# Patient Record
Sex: Female | Born: 1990 | Race: Black or African American | Hispanic: No | Marital: Single | Smoking: Former smoker
Health system: Southern US, Community
[De-identification: ages and names within clinical notes are randomized; demographics above are authoritative.]

## PROBLEM LIST (undated history)

## (undated) DIAGNOSIS — F329 Major depressive disorder, single episode, unspecified: Secondary | ICD-10-CM

## (undated) DIAGNOSIS — E119 Type 2 diabetes mellitus without complications: Secondary | ICD-10-CM

## (undated) DIAGNOSIS — F419 Anxiety disorder, unspecified: Secondary | ICD-10-CM

## (undated) DIAGNOSIS — F32A Depression, unspecified: Secondary | ICD-10-CM

## (undated) DIAGNOSIS — G4700 Insomnia, unspecified: Secondary | ICD-10-CM

---

## 2010-09-10 ENCOUNTER — Emergency Department (HOSPITAL_COMMUNITY)
Admission: EM | Admit: 2010-09-10 | Discharge: 2010-09-11 | Disposition: A | Discharge status: Home / Self Care | Payer: 59 | Attending: Emergency Medicine | Admitting: Emergency Medicine

## 2010-09-10 DIAGNOSIS — R112 Nausea with vomiting, unspecified: Secondary | ICD-10-CM | POA: Insufficient documentation

## 2010-09-10 DIAGNOSIS — R109 Unspecified abdominal pain: Secondary | ICD-10-CM | POA: Insufficient documentation

## 2010-09-10 DIAGNOSIS — R197 Diarrhea, unspecified: Secondary | ICD-10-CM | POA: Insufficient documentation

## 2010-09-10 LAB — BASIC METABOLIC PANEL
CO2: 22 mEq/L (ref 19–32)
Chloride: 98 mEq/L (ref 96–112)
GFR calc Af Amer: >60 mL/min (ref >60)
Potassium: 5.5 mEq/L — ABNORMAL HIGH (ref 3.5–5.1)
Sodium: 134 mEq/L — ABNORMAL LOW (ref 135–145)

## 2010-09-10 LAB — DIFFERENTIAL
Basophils Absolute: 0 10*3/uL (ref 0.0–0.1)
Basophils Relative: 0 % (ref 0–1)
Monocytes Relative: 5 % (ref 3–12)
Neutro Abs: 16.8 10*3/uL — ABNORMAL HIGH (ref 1.7–7.7)
Neutrophils Relative %: 90 % — ABNORMAL HIGH (ref 43–77)

## 2010-09-10 LAB — CBC
Hemoglobin: 15.9 g/dL — ABNORMAL HIGH (ref 12.0–15.0)
RBC: 6.37 MIL/uL — ABNORMAL HIGH (ref 3.87–5.11)

## 2010-09-11 ENCOUNTER — Emergency Department (HOSPITAL_COMMUNITY): Payer: 59

## 2010-09-11 LAB — HEPATIC FUNCTION PANEL
AST: 27 U/L (ref 0–37)
Albumin: 4.8 g/dL (ref 3.5–5.2)
Alkaline Phosphatase: 96 U/L (ref 39–117)
Total Bilirubin: 1.6 mg/dL — ABNORMAL HIGH (ref 0.3–1.2)

## 2010-09-11 LAB — URINE MICROSCOPIC-ADD ON

## 2010-09-11 LAB — URINALYSIS, ROUTINE W REFLEX MICROSCOPIC
Bilirubin Urine: NEGATIVE
Nitrite: NEGATIVE
Specific Gravity, Urine: 1.027 (ref 1.005–1.030)
pH: 5.5 (ref 5.0–8.0)

## 2010-09-11 MED ORDER — IOHEXOL 300 MG/ML  SOLN
125.0000 mL | Freq: Once | INTRAMUSCULAR | Status: AC | PRN
  Administered 2010-09-11: 125 mL via INTRAVENOUS

## 2014-09-10 ENCOUNTER — Encounter: Payer: Self-pay | Admitting: Family Medicine

## 2014-09-10 ENCOUNTER — Ambulatory Visit (INDEPENDENT_AMBULATORY_CARE_PROVIDER_SITE_OTHER): Payer: 59 | Admitting: Family Medicine

## 2014-09-10 VITALS — BP 130/82 | Ht 61.0 in | Wt 158.0 lb

## 2014-09-10 DIAGNOSIS — M272 Inflammatory conditions of jaws: Secondary | ICD-10-CM | POA: Diagnosis not present

## 2014-09-10 MED ORDER — HYDROCODONE-ACETAMINOPHEN 5-325 MG PO TABS: 1.0000 | ORAL_TABLET | Freq: Four times a day (QID) | ORAL | Status: DC | PRN

## 2014-09-10 MED ORDER — PENICILLIN V POTASSIUM 500 MG PO TABS: 500.0000 mg | ORAL_TABLET | Freq: Four times a day (QID) | ORAL | Status: DC

## 2014-09-10 NOTE — Progress Notes (Signed)
   Subjective:    Patient ID: Ann Richards, female    DOB: 10/11/1990, 24 y.o.   MRN: [id number]  HPI Patient arrives with left sided facial pain for 1.5 weeks.  Shooting pain  Really bad  Throbbing and aching  tyl helps about an hour  aleave doesbt help at all     Review of Systems No headache no chest pain no back pain no abdominal pain ROS otherwise negative    Objective:   Physical Exam   Alert no major distress lungs clear heart rare rhythm gums very inflamed particularly lower jaw tender percussion to molar tooth pharynx normal neck supple lungs clear. Heart regular in rhythm.     Assessment & Plan:  Impression odontogenic infection with gingivitis plan penicillin 4 times a day 10 days. Pain medication prescribed. Strongly encouraged calling dentist as soon as possible [facility]

## 2014-09-18 ENCOUNTER — Ambulatory Visit: Payer: 59 | Admitting: Nurse Practitioner

## 2014-10-06 ENCOUNTER — Ambulatory Visit (INDEPENDENT_AMBULATORY_CARE_PROVIDER_SITE_OTHER): Payer: 59 | Admitting: Nurse Practitioner

## 2014-10-06 ENCOUNTER — Encounter: Payer: Self-pay | Admitting: Nurse Practitioner

## 2014-10-06 ENCOUNTER — Encounter: Payer: Self-pay | Admitting: Family Medicine

## 2014-10-06 VITALS — BP 164/96 | Temp 98.7°F | Ht 61.0 in | Wt 148.4 lb

## 2014-10-06 DIAGNOSIS — J069 Acute upper respiratory infection, unspecified: Secondary | ICD-10-CM

## 2014-10-06 DIAGNOSIS — J209 Acute bronchitis, unspecified: Secondary | ICD-10-CM | POA: Diagnosis not present

## 2014-10-06 DIAGNOSIS — R062 Wheezing: Secondary | ICD-10-CM | POA: Diagnosis not present

## 2014-10-06 DIAGNOSIS — B9689 Other specified bacterial agents as the cause of diseases classified elsewhere: Secondary | ICD-10-CM

## 2014-10-06 MED ORDER — AZITHROMYCIN 250 MG PO TABS: ORAL_TABLET | ORAL | Status: DC

## 2014-10-06 MED ORDER — ALBUTEROL SULFATE (2.5 MG/3ML) 0.083% IN NEBU
2.5000 mg | INHALATION_SOLUTION | Freq: Once | RESPIRATORY_TRACT | Status: AC
  Administered 2014-10-06: 2.5 mg via RESPIRATORY_TRACT

## 2014-10-06 MED ORDER — PREDNISONE 20 MG PO TABS: ORAL_TABLET | ORAL | Status: DC

## 2014-10-06 MED ORDER — ALBUTEROL SULFATE HFA 108 (90 BASE) MCG/ACT IN AERS: 2.0000 | INHALATION_SPRAY | RESPIRATORY_TRACT | Status: DC | PRN

## 2014-10-07 ENCOUNTER — Ambulatory Visit: Payer: 59 | Admitting: Nurse Practitioner

## 2014-10-07 ENCOUNTER — Encounter: Payer: Self-pay | Admitting: Family Medicine

## 2014-10-07 ENCOUNTER — Telehealth: Payer: Self-pay | Admitting: Family Medicine

## 2014-10-07 MED ORDER — AMOXICILLIN 500 MG PO CAPS: 500.0000 mg | ORAL_CAPSULE | Freq: Three times a day (TID) | ORAL | Status: DC

## 2014-10-07 NOTE — Telephone Encounter (Signed)
Pt called stating that the azithromycin that she was prescribed last night  Gave her stomach pains and was vomiting.

## 2014-10-07 NOTE — Telephone Encounter (Signed)
Rx sent electronically to pharmacy. Patient notified. Patient needs a doctors note for work today and will come by to pick it up.

## 2014-10-07 NOTE — Telephone Encounter (Signed)
amox 500 tid for ten d

## 2014-10-08 ENCOUNTER — Encounter: Payer: Self-pay | Admitting: Nurse Practitioner

## 2014-10-08 NOTE — Progress Notes (Signed)
Subjective:  Presents for c/o cold symptoms for over a week. Fever improved. Sinus headache. Cough producing light green sputum. Right ear pain. Runny nose. Some wheezing. Taking fluids well. Voiding nl.   Objective:   BP 164/96 mmHg  Temp(Src) 98.7 F (37.1 C) (Oral)  Ht 5\' 1"  (1.549 m)  Wt 148 lb 6.4 oz (67.314 kg)  BMI 28.05 kg/m2  LMP 09/15/2014 NAD. Alert, oriented. TMs retracted, no erythema. Pharynx injected with PND noted. Neck supple with mild anterior adenopathy. Lungs initially mildly decreased breath sounds with scattered expiratory wheezes. No tachypnea. Normal color. Given albuterol 2.5 mg neb treatment. Wheezing resolved with subjective improvement of symptoms. Heart RRR.   Assessment: Bacterial upper respiratory infection - Plan: albuterol (PROVENTIL) (2.5 MG/3ML) 0.083% nebulizer solution 2.5 mg  Acute bronchitis, unspecified organism - Plan: albuterol (PROVENTIL) (2.5 MG/3ML) 0.083% nebulizer solution 2.5 mg  Wheezing - Plan: albuterol (PROVENTIL) (2.5 MG/3ML) 0.083% nebulizer solution 2.5 mg  Plan:  Meds ordered this encounter  Medications  . albuterol (PROVENTIL) (2.5 MG/3ML) 0.083% nebulizer solution 2.5 mg    Sig:   . predniSONE (DELTASONE) 20 MG tablet    Sig: 3 po qd x 3 d then 2 po qd x 3 d then 1 po qd x 3 d    Dispense:  18 tablet    Refill:  0    Order Specific Question:  Supervising Provider    Answer:  Merlyn AlbertLUKING, WILLIAM S [[id number]]  . albuterol (PROVENTIL HFA;VENTOLIN HFA) 108 (90 BASE) MCG/ACT inhaler    Sig: Inhale 2 puffs into the lungs every 4 (four) hours as needed.    Dispense:  1 Inhaler    Refill:  0    Order Specific Question:  Supervising Provider    Answer:  Merlyn AlbertLUKING, WILLIAM S [[id number]]  . azithromycin (ZITHROMAX Z-PAK) 250 MG tablet    Sig: Take 2 tablets (500 mg) on  Day 1,  followed by 1 tablet (250 mg) once daily on Days 2 through 5.    Dispense:  6 each    Refill:  0    Order Specific Question:  Supervising Provider    Answer:  Merlyn AlbertLUKING,  WILLIAM S [[id number]]   Reviewed symptomatic care and warning signs. Call back in 72 hours if no improvement, sooner if worse.

## 2015-06-10 ENCOUNTER — Encounter: Payer: Self-pay | Admitting: Family Medicine

## 2015-06-10 ENCOUNTER — Ambulatory Visit (INDEPENDENT_AMBULATORY_CARE_PROVIDER_SITE_OTHER): Payer: 59 | Admitting: Family Medicine

## 2015-06-10 VITALS — BP 134/88 | Temp 98.8°F | Ht 61.0 in | Wt 158.5 lb

## 2015-06-10 DIAGNOSIS — K047 Periapical abscess without sinus: Secondary | ICD-10-CM

## 2015-06-10 DIAGNOSIS — J019 Acute sinusitis, unspecified: Secondary | ICD-10-CM

## 2015-06-10 MED ORDER — AMOXICILLIN 500 MG PO CAPS: 500.0000 mg | ORAL_CAPSULE | Freq: Three times a day (TID) | ORAL | Status: DC

## 2015-06-10 MED ORDER — HYDROCODONE-ACETAMINOPHEN 5-325 MG PO TABS: 1.0000 | ORAL_TABLET | ORAL | Status: DC | PRN

## 2015-06-10 NOTE — Progress Notes (Signed)
   Subjective:    Patient ID: Ann Richards, female    DOB: 17-Apr-1991, 24 y.o.   MRN: [id number]  Sinusitis This is a new problem. The current episode started in the past 7 days. Associated symptoms include chills, congestion, coughing, ear pain, headaches and a sore throat. Pertinent negatives include no shortness of breath. Treatments tried: Aleve, OTC cough suppresant.   Patient also has c/o of tooth pain.   patient relates tooth pain swelling around the to swelling in the jaw hurts with chewing present over the past couple days  Review of Systems  Constitutional: Positive for chills. Negative for fever and activity change.  HENT: Positive for congestion, ear pain, rhinorrhea and sore throat.   Eyes: Negative for discharge.  Respiratory: Positive for cough. Negative for shortness of breath and wheezing.   Cardiovascular: Negative for chest pain.  Neurological: Positive for headaches.   Jaw pain and swelling    Objective:   Physical Exam  Constitutional: She appears well-developed.  HENT:  Head: Normocephalic.  Nose: Nose normal.  Mouth/Throat: Oropharynx is clear and moist. No oropharyngeal exudate.  Neck: Neck supple.  Cardiovascular: Normal rate and normal heart sounds.   No murmur heard. Pulmonary/Chest: Effort normal and breath sounds normal. She has no wheezes.  Lymphadenopathy:    She has no cervical adenopathy.  Skin: Skin is warm and dry.  Nursing note and vitals reviewed.         Assessment & Plan:   patient has an infection of the jaw area I believe is due to a dental infection. Amoxicillin 3 times a day, Vicodin for pain, see her dentist. May need tooth pull or root canal.  Rhinosinusitis mild antibiotic should help this  Lab work x-rays not indicated currently if worse follow-up

## 2015-07-03 ENCOUNTER — Emergency Department (HOSPITAL_COMMUNITY)
Admission: EM | Admit: 2015-07-03 | Discharge: 2015-07-03 | Disposition: A | Discharge status: Home / Self Care | Payer: 59 | Attending: Emergency Medicine | Admitting: Emergency Medicine

## 2015-07-03 ENCOUNTER — Encounter (HOSPITAL_COMMUNITY): Payer: Self-pay | Admitting: *Deleted

## 2015-07-03 ENCOUNTER — Emergency Department (HOSPITAL_COMMUNITY): Payer: 59

## 2015-07-03 DIAGNOSIS — Y998 Other external cause status: Secondary | ICD-10-CM | POA: Insufficient documentation

## 2015-07-03 DIAGNOSIS — Y9389 Activity, other specified: Secondary | ICD-10-CM | POA: Insufficient documentation

## 2015-07-03 DIAGNOSIS — Z3202 Encounter for pregnancy test, result negative: Secondary | ICD-10-CM | POA: Insufficient documentation

## 2015-07-03 DIAGNOSIS — Z87891 Personal history of nicotine dependence: Secondary | ICD-10-CM | POA: Diagnosis not present

## 2015-07-03 DIAGNOSIS — Y92410 Unspecified street and highway as the place of occurrence of the external cause: Secondary | ICD-10-CM | POA: Diagnosis not present

## 2015-07-03 DIAGNOSIS — S199XXA Unspecified injury of neck, initial encounter: Secondary | ICD-10-CM | POA: Diagnosis present

## 2015-07-03 DIAGNOSIS — S0003XA Contusion of scalp, initial encounter: Secondary | ICD-10-CM | POA: Diagnosis not present

## 2015-07-03 DIAGNOSIS — S161XXA Strain of muscle, fascia and tendon at neck level, initial encounter: Secondary | ICD-10-CM | POA: Insufficient documentation

## 2015-07-03 DIAGNOSIS — Z79899 Other long term (current) drug therapy: Secondary | ICD-10-CM | POA: Insufficient documentation

## 2015-07-03 DIAGNOSIS — S0990XA Unspecified injury of head, initial encounter: Secondary | ICD-10-CM

## 2015-07-03 LAB — POC URINE PREG, ED: Preg Test, Ur: NEGATIVE

## 2015-07-03 MED ORDER — CYCLOBENZAPRINE HCL 10 MG PO TABS: 10.0000 mg | ORAL_TABLET | Freq: Three times a day (TID) | ORAL | Status: DC | PRN

## 2015-07-03 MED ORDER — NAPROXEN 500 MG PO TABS: 500.0000 mg | ORAL_TABLET | Freq: Two times a day (BID) | ORAL | Status: DC

## 2015-07-03 MED ORDER — HYDROCODONE-ACETAMINOPHEN 5-325 MG PO TABS: ORAL_TABLET | ORAL | Status: DC

## 2015-07-03 NOTE — Discharge Instructions (Signed)
Cervical Sprain A cervical sprain is when the tissues (ligaments) that hold the neck bones in place stretch or tear. HOME CARE   Put ice on the injured area.  Put ice in a plastic bag.  Place a towel between your skin and the bag.  Leave the ice on for 15-20 minutes, 3-4 times a day.  You may have been given a collar to wear. This collar keeps your neck from moving while you heal.  Do not take the collar off unless told by your doctor.  If you have long hair, keep it outside of the collar.  Ask your doctor before changing the position of your collar. You may need to change its position over time to make it more comfortable.  If you are allowed to take off the collar for cleaning or bathing, follow your doctor's instructions on how to do it safely.  Keep your collar clean by wiping it with mild soap and water. Dry it completely. If the collar has removable pads, remove them every 1-2 days to hand wash them with soap and water. Allow them to air dry. They should be dry before you wear them in the collar.  Do not drive while wearing the collar.  Only take medicine as told by your doctor.  Keep all doctor visits as told.  Keep all physical therapy visits as told.  Adjust your work station so that you have good posture while you work.  Avoid positions and activities that make your problems worse.  Warm up and stretch before being active. GET HELP IF:  Your pain is not controlled with medicine.  You cannot take less pain medicine over time as planned.  Your activity level does not improve as expected. GET HELP RIGHT AWAY IF:   You are bleeding.  Your stomach is upset.  You have an allergic reaction to your medicine.  You develop new problems that you cannot explain.  You lose feeling (become numb) or you cannot move any part of your body (paralysis).  You have tingling or weakness in any part of your body.  Your symptoms get worse. Symptoms include:  Pain,  soreness, stiffness, puffiness (swelling), or a burning feeling in your neck.  Pain when your neck is touched.  Shoulder or upper back pain.  Limited ability to move your neck.  Headache.  Dizziness.  Your hands or arms feel week, lose feeling, or tingle.  Muscle spasms.  Difficulty swallowing or chewing. MAKE SURE YOU:   Understand these instructions.  Will watch your condition.  Will get help right away if you are not doing well or get worse.   This information is not intended to replace advice given to you by your health care provider. Make sure you discuss any questions you have with your health care provider.   Document Released: 11/23/2007 Document Revised: 02/06/2013 Document Reviewed: 12/12/2012 Elsevier Interactive Patient Education 2016 [employer]  Head Injury, Adult You have a head injury. Headaches and throwing up (vomiting) are common after a head injury. It should be easy to wake up from sleeping. Sometimes you must stay in the hospital. Most problems happen within the first 24 hours. Side effects may occur up to 7-10 days after the injury.  WHAT ARE THE TYPES OF HEAD INJURIES? Head injuries can be as minor as a bump. Some head injuries can be more severe. More severe head injuries include:  A jarring injury to the brain (concussion).  A bruise of the brain (contusion). This  there is bleeding in the brain that can cause swelling.  A cracked skull (skull fracture).  Bleeding in the brain that collects, clots, and forms a bump (hematoma). WHEN SHOULD I GET HELP RIGHT AWAY?   You are confused or sleepy.  You cannot be woken up.  You feel sick to your stomach (nauseous) or keep throwing up (vomiting).  Your dizziness or unsteadiness is getting worse.  You have very bad, lasting headaches that are not helped by medicine. Take medicines only as told by your doctor.  You cannot use your arms or legs like normal.  You cannot walk.  You notice  changes in the black spots in the center of the colored part of your eye (pupil).  You have clear or bloody fluid coming from your nose or ears.  You have trouble seeing. During the next 24 hours after the injury, you must stay with someone who can watch you. This person should get help right away (call 911 in the [place] if you start to shake and are not able to control it (have seizures), you pass out, or you are unable to wake up. HOW CAN I PREVENT A HEAD INJURY IN THE FUTURE?  Wear seat belts.  Wear a helmet while bike riding and playing sports like football.  Stay away from dangerous activities around the house. WHEN CAN I RETURN TO NORMAL ACTIVITIES AND ATHLETICS? See your doctor before doing these activities. You should not do normal activities or play contact sports until 1 week after the following symptoms have stopped:  Headache that does not go away.  Dizziness.  Poor attention.  Confusion.  Memory problems.  Sickness to your stomach or throwing up.  Tiredness.  Fussiness.  Bothered by bright lights or loud noises.  Anxiousness or depression.  Restless sleep. MAKE SURE YOU:   Understand these instructions.  Will watch your condition.  Will get help right away if you are not doing well or get worse.   This information is not intended to replace advice given to you by your health care provider. Make sure you discuss any questions you have with your health care provider.   Document Released: 05/19/2008 Document Revised: 06/27/2014 Document Reviewed: 02/11/2013 Elsevier Interactive Patient Education 2016 Elsevier Inc.  

## 2015-07-03 NOTE — ED Notes (Signed)
Talked withpt about pt's BP and need to follow closely. Pt reports she had taken doses of cold medication over the past couple of days and that usually makes her BP high. Pt encouraged to recheck her pressure and follow-up if it remains elevated.
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
## 2015-08-04 ENCOUNTER — Encounter: Payer: Self-pay | Admitting: Nurse Practitioner

## 2015-08-04 ENCOUNTER — Ambulatory Visit (INDEPENDENT_AMBULATORY_CARE_PROVIDER_SITE_OTHER): Payer: 59 | Admitting: Nurse Practitioner

## 2015-08-04 ENCOUNTER — Encounter: Payer: Self-pay | Admitting: Family Medicine

## 2015-08-04 VITALS — BP 122/84 | Temp 98.8°F | Ht 61.0 in | Wt 159.0 lb

## 2015-08-04 DIAGNOSIS — K047 Periapical abscess without sinus: Secondary | ICD-10-CM

## 2015-08-04 MED ORDER — PENICILLIN V POTASSIUM 500 MG PO TABS: 500.0000 mg | ORAL_TABLET | Freq: Four times a day (QID) | ORAL | Status: DC

## 2015-08-04 MED ORDER — ALBUTEROL SULFATE HFA 108 (90 BASE) MCG/ACT IN AERS: 2.0000 | INHALATION_SPRAY | RESPIRATORY_TRACT | Status: DC | PRN

## 2015-08-04 MED ORDER — HYDROCODONE-ACETAMINOPHEN 5-325 MG PO TABS: ORAL_TABLET | ORAL | Status: DC

## 2015-08-04 MED ORDER — FLUCONAZOLE 150 MG PO TABS: ORAL_TABLET | ORAL | Status: DC

## 2015-08-06 ENCOUNTER — Encounter: Payer: Self-pay | Admitting: Nurse Practitioner

## 2015-08-06 NOTE — Progress Notes (Signed)
Subjective:  Presents for complaints of pain related to an abscessed tooth for the past 2 days. Has an appointment with an oral surgeon in 2 weeks for evaluation. No fever. Mild headache and ear pain on the right side where tooth is infected. Taking fluids well. Voiding normal limit. Is able to eat soft foods.  Objective:   BP 122/84 mmHg  Temp(Src) 98.8 F (37.1 C) (Oral)  Ht  (1.549 m)  Wt 159 lb (72.122 kg)  BMI 30.06 kg/m2 NAD. Alert, oriented. Mild right mid facial edema noted. No erythema. Tenderness and mild erythema noted all along the right lower gingiva near the teeth, very tender to light palpation.  Assessment: Abscessed tooth  Plan:  Meds ordered this encounter  Medications  . penicillin v potassium (VEETID) 500 MG tablet    Sig: Take 1 tablet (500 mg total) by mouth 4 (four) times daily.    Dispense:  40 tablet    Refill:  0    Order Specific Question:  Supervising Provider    Answer:  [name] [[id number]]  . fluconazole (DIFLUCAN) 150 MG tablet    Sig: One po qd prn yeast infection; may repeat in 3-4 days if needed    Dispense:  2 tablet    Refill:  0    Order Specific Question:  Supervising Provider    Answer:  [name] [[id number]]  . HYDROcodone-acetaminophen (NORCO/VICODIN) 5-325 MG tablet    Sig: Take one tab po q 4-6 hrs prn pain    Dispense:  20 tablet    Refill:  0    Order Specific Question:  Supervising Provider    Answer:  [name] [[id number]]  . albuterol (PROVENTIL HFA;VENTOLIN HFA) 108 (90 Base) MCG/ACT inhaler    Sig: Inhale 2 puffs into the lungs every 4 (four) hours as needed.    Dispense:  1 Inhaler    Refill:  0    Order Specific Question:  Supervising Provider    Answer:  [name] [[id number]]   Use pain medicine sparingly, drowsiness precautions. Call back in 48 hours if no improvement, sooner if worse. Warning signs reviewed. Otherwise follow-up with oral surgeon as planned. Also continue ibuprofen as directed.

## 2015-11-12 ENCOUNTER — Encounter: Payer: Self-pay | Admitting: Family Medicine

## 2015-11-12 ENCOUNTER — Ambulatory Visit (INDEPENDENT_AMBULATORY_CARE_PROVIDER_SITE_OTHER): Payer: 59 | Admitting: Family Medicine

## 2015-11-12 VITALS — BP 116/76 | Ht 61.0 in | Wt 157.1 lb

## 2015-11-12 DIAGNOSIS — F411 Generalized anxiety disorder: Secondary | ICD-10-CM | POA: Diagnosis not present

## 2015-11-12 DIAGNOSIS — F329 Major depressive disorder, single episode, unspecified: Secondary | ICD-10-CM | POA: Diagnosis not present

## 2015-11-12 DIAGNOSIS — F32A Depression, unspecified: Secondary | ICD-10-CM

## 2015-11-12 MED ORDER — ALBUTEROL SULFATE HFA 108 (90 BASE) MCG/ACT IN AERS: 2.0000 | INHALATION_SPRAY | RESPIRATORY_TRACT | Status: DC | PRN

## 2015-11-12 MED ORDER — ALPRAZOLAM 0.5 MG PO TABS: ORAL_TABLET | ORAL | Status: DC

## 2015-11-12 MED ORDER — ESCITALOPRAM OXALATE 10 MG PO TABS: 10.0000 mg | ORAL_TABLET | Freq: Every day | ORAL | Status: DC
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
## 2015-11-12 NOTE — Patient Instructions (Signed)
Please dont miss your follow up visitGeneralized Anxiety Disorder Generalized anxiety disorder (GAD) is a mental disorder. It interferes with life functions, including relationships, work, and school. GAD is different from normal anxiety, which everyone experiences at some point in their lives in response to specific life events and activities. Normal anxiety actually helps us prepare for and get through these life events and activities. Normal anxiety goes away after the event or activity is over.  GAD causes anxiety that is not necessarily related to specific events or activities. It also causes excess anxiety in proportion to specific events or activities. The anxiety associated with GAD is also difficult to control. GAD can vary from mild to severe. People with severe GAD can have intense waves of anxiety with physical symptoms (panic attacks).  SYMPTOMS The anxiety and worry associated with GAD are difficult to control. This anxiety and worry are related to many life events and activities and also occur more days than not for 6 months or longer. People with GAD also have three or more of the following symptoms (one or more in children):  Restlessness.   Fatigue.  Difficulty concentrating.   Irritability.  Muscle tension.  Difficulty sleeping or unsatisfying sleep. DIAGNOSIS GAD is diagnosed through an assessment by your health care provider. Your health care provider will ask you questions aboutyour mood,physical symptoms, and events in your life. Your health care provider may ask you about your medical history and use of alcohol or drugs, including prescription medicines. Your health care provider may also do a physical exam and blood tests. Certain medical conditions and the use of certain substances can cause symptoms similar to those associated with GAD. Your health care provider may refer you to a mental health specialist for further evaluation. TREATMENT The following therapies  are usually used to treat GAD:   Medication. Antidepressant medication usually is prescribed for long-term daily control. Antianxiety medicines may be added in severe cases, especially when panic attacks occur.   Talk therapy (psychotherapy). Certain types of talk therapy can be helpful in treating GAD by providing support, education, and guidance. A form of talk therapy called cognitive behavioral therapy can teach you healthy ways to think about and react to daily life events and activities.  Stress managementtechniques. These include yoga, meditation, and exercise and can be very helpful when they are practiced regularly. A mental health specialist can help determine which treatment is best for you. Some people see improvement with one therapy. However, other people require a combination of therapies.   This information is not intended to replace advice given to you by your health care provider. Make sure you discuss any questions you have with your health care provider.   Document Released: 10/01/2012 Document Revised: 06/27/2014 Document Reviewed: 10/01/2012 Elsevier Interactive Patient Education [employer]2016 [employer]vier Inc.

## 2015-11-13 ENCOUNTER — Telehealth: Payer: Self-pay | Admitting: Family Medicine

## 2015-11-13 DIAGNOSIS — F4323 Adjustment disorder with mixed anxiety and depressed mood: Secondary | ICD-10-CM

## 2015-11-13 NOTE — Telephone Encounter (Signed)
Referral ordered in EPIC. Patient notified. 

## 2015-11-13 NOTE — Telephone Encounter (Signed)
Pt called stating that she would like the referral that Dr. Lorin PicketScott offered to see psychiatrist.

## 2015-11-13 NOTE — Telephone Encounter (Signed)
Please initiate referral to psychiatry for anxiety related issues let the patient know that this has been done at her request

## 2015-11-16 DIAGNOSIS — F329 Major depressive disorder, single episode, unspecified: Secondary | ICD-10-CM | POA: Insufficient documentation

## 2015-11-16 DIAGNOSIS — F411 Generalized anxiety disorder: Secondary | ICD-10-CM | POA: Insufficient documentation

## 2015-11-16 DIAGNOSIS — F32A Depression, unspecified: Secondary | ICD-10-CM | POA: Insufficient documentation

## 2015-11-17 ENCOUNTER — Encounter: Payer: Self-pay | Admitting: Family Medicine

## 2015-11-26 ENCOUNTER — Telehealth (HOSPITAL_COMMUNITY): Payer: Self-pay | Admitting: *Deleted

## 2015-11-26 NOTE — Telephone Encounter (Signed)
spoke with patient regarding an appointment.   She said she will call us back when she find out how much her copay is.

## 2015-12-01 ENCOUNTER — Telehealth (HOSPITAL_COMMUNITY): Payer: Self-pay | Admitting: *Deleted

## 2015-12-01 NOTE — Telephone Encounter (Signed)
Office received ref from [facility]. To sch new pt appt for pt. Spoke with pt and she stated that she wanted to wait and the last lady she talked to she told them that she would call them back. [facility] informed [facility] of what pt stated and stated she will note that.

## 2015-12-14 ENCOUNTER — Other Ambulatory Visit: Payer: Self-pay | Admitting: Family Medicine

## 2015-12-14 ENCOUNTER — Ambulatory Visit: Payer: 59 | Admitting: Family Medicine

## 2015-12-15 NOTE — Telephone Encounter (Signed)
Is pt seeing psychiatrist ?(If so, they should rx the xanax. Or is she seeing psychologist, if so refill times one since they do not rx meds

## 2015-12-15 NOTE — Telephone Encounter (Signed)
Pt states she is not seeing a psychiatrist. She is seeing a Veterinary surgeoncounselor. Med refilled one time

## 2016-03-08 ENCOUNTER — Other Ambulatory Visit: Payer: Self-pay | Admitting: Family Medicine

## 2016-03-09 ENCOUNTER — Ambulatory Visit (INDEPENDENT_AMBULATORY_CARE_PROVIDER_SITE_OTHER): Payer: 59 | Admitting: Family Medicine

## 2016-03-09 ENCOUNTER — Encounter: Payer: Self-pay | Admitting: Family Medicine

## 2016-03-09 VITALS — BP 120/76 | Temp 98.3°F | Ht 61.0 in | Wt 155.0 lb

## 2016-03-09 DIAGNOSIS — B9689 Other specified bacterial agents as the cause of diseases classified elsewhere: Secondary | ICD-10-CM

## 2016-03-09 DIAGNOSIS — J019 Acute sinusitis, unspecified: Secondary | ICD-10-CM

## 2016-03-09 DIAGNOSIS — J4520 Mild intermittent asthma, uncomplicated: Secondary | ICD-10-CM | POA: Diagnosis not present

## 2016-03-09 MED ORDER — ALBUTEROL SULFATE (2.5 MG/3ML) 0.083% IN NEBU
2.5000 mg | INHALATION_SOLUTION | Freq: Once | RESPIRATORY_TRACT | Status: AC
  Administered 2016-03-09: 2.5 mg via RESPIRATORY_TRACT

## 2016-03-09 MED ORDER — METHYLPREDNISOLONE 4 MG PO TABS: ORAL_TABLET | ORAL | 0 refills | Status: DC

## 2016-03-09 MED ORDER — METHYLPREDNISOLONE ACETATE 40 MG/ML IJ SUSP
40.0000 mg | Freq: Once | INTRAMUSCULAR | Status: AC
  Administered 2016-03-09: 40 mg via INTRAMUSCULAR

## 2016-03-09 MED ORDER — AZITHROMYCIN 250 MG PO TABS: ORAL_TABLET | ORAL | 0 refills | Status: DC

## 2016-03-09 NOTE — Progress Notes (Signed)
   Subjective:    Patient ID: Ann Richards, female    DOB: [birth year], 25 y.o.   MRN: [id number]  Sinusitis  This is a new problem. The current episode started in the past 7 days. The problem is unchanged. There has been no fever. The pain is moderate. Associated symptoms include congestion, coughing, ear pain, headaches and a sore throat. Pertinent negatives include no shortness of breath. (Wheezing) Past treatments include oral decongestants. The treatment provided no relief.   Patient states that she was prescribed a steroid in the past and it made her very sick.    Review of Systems  Constitutional: Negative for activity change and fever.  HENT: Positive for congestion, ear pain, rhinorrhea and sore throat.   Eyes: Negative for discharge.  Respiratory: Positive for cough and wheezing. Negative for shortness of breath.   Cardiovascular: Negative for chest pain.  Neurological: Positive for headaches.       Objective:   Physical Exam  Constitutional: She appears well-developed.  HENT:  Head: Normocephalic.  Nose: Nose normal.  Mouth/Throat: Oropharynx is clear and moist. No oropharyngeal exudate.  Neck: Neck supple.  Cardiovascular: Normal rate and normal heart sounds.   No murmur heard. Pulmonary/Chest: Effort normal. She has wheezes.  Lymphadenopathy:    She has no cervical adenopathy.  Skin: Skin is warm and dry.  Nursing note and vitals reviewed.  The patient does have wheezes not rest or distress given nebulizer treatment it did seem to help. Patient able to talk without being out of breath. Patient does have a history of reactive airway      Assessment & Plan:  Patient is having reactive airway flareup along with possible sinusitis-she was given nebulizer treatment did seem to help some. She did not 1 go on prednisone but after long discussion she understands the importance of steroids. She agrees to use Medrol tablets and she agrees to the Depo-Medrol  injection she will use albuterol on a regular basis over the course of the next few days if she gets worse she will go to the ER. Zithromax prescribed. Patient encouraged to rest over the next couple days.

## 2016-08-24 ENCOUNTER — Encounter (HOSPITAL_COMMUNITY): Payer: Self-pay

## 2016-08-24 DIAGNOSIS — Z87891 Personal history of nicotine dependence: Secondary | ICD-10-CM | POA: Insufficient documentation

## 2016-08-24 DIAGNOSIS — A419 Sepsis, unspecified organism: Secondary | ICD-10-CM | POA: Insufficient documentation

## 2016-08-24 DIAGNOSIS — Z79899 Other long term (current) drug therapy: Secondary | ICD-10-CM | POA: Insufficient documentation

## 2016-08-24 DIAGNOSIS — K353 Acute appendicitis with localized peritonitis: Principal | ICD-10-CM | POA: Insufficient documentation

## 2016-08-24 DIAGNOSIS — Z791 Long term (current) use of non-steroidal anti-inflammatories (NSAID): Secondary | ICD-10-CM | POA: Insufficient documentation

## 2016-08-24 LAB — CBC
HEMATOCRIT: 41 % (ref 36.0–46.0)
HEMOGLOBIN: 13.5 g/dL (ref 12.0–15.0)
MCH: 26.6 pg (ref 26.0–34.0)
MCHC: 32.9 g/dL (ref 30.0–36.0)
MCV: 80.9 fL (ref 78.0–100.0)
Platelets: 254 10*3/uL (ref 150–400)
RBC: 5.07 MIL/uL (ref 3.87–5.11)
RDW: 13.5 % (ref 11.5–15.5)
WBC: 24.1 10*3/uL — ABNORMAL HIGH (ref 4.0–10.5)

## 2016-08-24 NOTE — ED Triage Notes (Signed)
Pt reports lower abd pain that started this am, vomited x 1, is also spotting despite having her menstrual cycle ended yesterday.

## 2016-08-25 ENCOUNTER — Observation Stay (HOSPITAL_COMMUNITY): Payer: Self-pay | Admitting: Anesthesiology

## 2016-08-25 ENCOUNTER — Encounter (HOSPITAL_COMMUNITY)
Admission: EM | Disposition: A | Discharge status: Home / Self Care | Payer: Self-pay | Source: Home / Self Care | Attending: Emergency Medicine

## 2016-08-25 ENCOUNTER — Observation Stay (HOSPITAL_COMMUNITY)
Admission: EM | Admit: 2016-08-25 | Discharge: 2016-08-26 | Disposition: A | Discharge status: Home / Self Care | Payer: 59 | Attending: General Surgery | Admitting: General Surgery

## 2016-08-25 ENCOUNTER — Emergency Department (HOSPITAL_COMMUNITY): Payer: Self-pay

## 2016-08-25 ENCOUNTER — Encounter (HOSPITAL_COMMUNITY): Payer: Self-pay | Admitting: *Deleted

## 2016-08-25 DIAGNOSIS — E1065 Type 1 diabetes mellitus with hyperglycemia: Secondary | ICD-10-CM

## 2016-08-25 DIAGNOSIS — A419 Sepsis, unspecified organism: Secondary | ICD-10-CM

## 2016-08-25 DIAGNOSIS — K353 Acute appendicitis with localized peritonitis, without perforation or gangrene: Secondary | ICD-10-CM

## 2016-08-25 DIAGNOSIS — K37 Unspecified appendicitis: Secondary | ICD-10-CM | POA: Diagnosis present

## 2016-08-25 HISTORY — PX: LAPAROSCOPIC APPENDECTOMY: SHX408

## 2016-08-25 LAB — URINALYSIS, ROUTINE W REFLEX MICROSCOPIC
Glucose, UA: 150 mg/dL — AB
Hgb urine dipstick: NEGATIVE
KETONES UR: 80 mg/dL — AB
Nitrite: NEGATIVE
PH: 5 (ref 5.0–8.0)
Protein, ur: 100 mg/dL — AB
Specific Gravity, Urine: 1.03 (ref 1.005–1.030)

## 2016-08-25 LAB — SURGICAL PCR SCREEN
MRSA, PCR: NEGATIVE
STAPHYLOCOCCUS AUREUS: NEGATIVE

## 2016-08-25 LAB — COMPREHENSIVE METABOLIC PANEL
ALBUMIN: 3.9 g/dL (ref 3.5–5.0)
ALT: 19 U/L (ref 14–54)
ANION GAP: 12 (ref 5–15)
AST: 15 U/L (ref 15–41)
Alkaline Phosphatase: 87 U/L (ref 38–126)
BILIRUBIN TOTAL: 1.3 mg/dL — AB (ref 0.3–1.2)
BUN: 7 mg/dL (ref 6–20)
CHLORIDE: 99 mmol/L — AB (ref 101–111)
CO2: 22 mmol/L (ref 22–32)
Calcium: 8.9 mg/dL (ref 8.9–10.3)
Creatinine, Ser: 0.57 mg/dL (ref 0.44–1.00)
GFR calc Af Amer: >60 mL/min (ref >60)
GFR calc non Af Amer: >60 mL/min (ref >60)
GLUCOSE: 226 mg/dL — AB (ref 65–99)
POTASSIUM: 3.2 mmol/L — AB (ref 3.5–5.1)
SODIUM: 133 mmol/L — AB (ref 135–145)
TOTAL PROTEIN: 8 g/dL (ref 6.5–8.1)

## 2016-08-25 LAB — GLUCOSE, CAPILLARY
Glucose-Capillary: 282 mg/dL — ABNORMAL HIGH (ref 65–99)
Glucose-Capillary: 267 mg/dL — ABNORMAL HIGH (ref 65–99)
Glucose-Capillary: 257 mg/dL — ABNORMAL HIGH (ref 65–99)
Glucose-Capillary: 255 mg/dL — ABNORMAL HIGH (ref 65–99)
Glucose-Capillary: 189 mg/dL — ABNORMAL HIGH (ref 65–99)

## 2016-08-25 LAB — I-STAT BETA HCG BLOOD, ED (MC, WL, AP ONLY): I-stat hCG, quantitative: <5 m[IU]/mL (ref <5)

## 2016-08-25 LAB — I-STAT CG4 LACTIC ACID, ED: LACTIC ACID, VENOUS: 1.63 mmol/L (ref 0.5–1.9)

## 2016-08-25 LAB — LIPASE, BLOOD

## 2016-08-25 LAB — POC URINE PREG, ED: Preg Test, Ur: NEGATIVE

## 2016-08-25 SURGERY — APPENDECTOMY, LAPAROSCOPIC
Anesthesia: General

## 2016-08-25 MED ORDER — ALBUTEROL SULFATE HFA 108 (90 BASE) MCG/ACT IN AERS
INHALATION_SPRAY | RESPIRATORY_TRACT | Status: DC | PRN
  Administered 2016-08-25 (×2): 2 via RESPIRATORY_TRACT

## 2016-08-25 MED ORDER — IOPAMIDOL (ISOVUE-300) INJECTION 61%
INTRAVENOUS | Status: AC
  Administered 2016-08-25: 30 mL
  Filled 2016-08-25: qty 30

## 2016-08-25 MED ORDER — SODIUM CHLORIDE 0.9 % IV BOLUS (SEPSIS)
1000.0000 mL | Freq: Once | INTRAVENOUS | Status: AC
  Administered 2016-08-25: 1000 mL via INTRAVENOUS

## 2016-08-25 MED ORDER — IOPAMIDOL (ISOVUE-300) INJECTION 61%
100.0000 mL | Freq: Once | INTRAVENOUS | Status: AC | PRN
  Administered 2016-08-25: 100 mL via INTRAVENOUS

## 2016-08-25 MED ORDER — INSULIN ASPART 100 UNIT/ML ~~LOC~~ SOLN: 0.0000 [IU] | Freq: Every day | SUBCUTANEOUS | Status: DC

## 2016-08-25 MED ORDER — ENOXAPARIN SODIUM 40 MG/0.4ML ~~LOC~~ SOLN
40.0000 mg | SUBCUTANEOUS | Status: DC
  Filled 2016-08-25: qty 0.4

## 2016-08-25 MED ORDER — HYDROMORPHONE HCL 1 MG/ML IJ SOLN
0.5000 mg | Freq: Once | INTRAMUSCULAR | Status: AC
  Administered 2016-08-25: 0.5 mg via INTRAVENOUS
  Filled 2016-08-25: qty 1

## 2016-08-25 MED ORDER — SODIUM CHLORIDE 0.9 % IV SOLN: INTRAVENOUS | Status: DC

## 2016-08-25 MED ORDER — LACTATED RINGERS IV SOLN
INTRAVENOUS | Status: DC
  Administered 2016-08-25 (×2): via INTRAVENOUS

## 2016-08-25 MED ORDER — SODIUM CHLORIDE 0.9 % IR SOLN
Status: DC | PRN
  Administered 2016-08-25: 1000 mL

## 2016-08-25 MED ORDER — MIDAZOLAM HCL 5 MG/5ML IJ SOLN
INTRAMUSCULAR | Status: DC | PRN
  Administered 2016-08-25: 2 mg via INTRAVENOUS

## 2016-08-25 MED ORDER — METRONIDAZOLE IN NACL 5-0.79 MG/ML-% IV SOLN
500.0000 mg | Freq: Once | INTRAVENOUS | Status: AC
  Administered 2016-08-25: 500 mg via INTRAVENOUS

## 2016-08-25 MED ORDER — METRONIDAZOLE IN NACL 5-0.79 MG/ML-% IV SOLN
500.0000 mg | Freq: Three times a day (TID) | INTRAVENOUS | Status: DC
  Administered 2016-08-25 – 2016-08-26 (×4): 500 mg via INTRAVENOUS
  Filled 2016-08-25 (×4): qty 100

## 2016-08-25 MED ORDER — MORPHINE SULFATE (PF) 4 MG/ML IV SOLN: 4.0000 mg | Freq: Once | INTRAVENOUS | Status: DC

## 2016-08-25 MED ORDER — PROPOFOL 10 MG/ML IV BOLUS
INTRAVENOUS | Status: AC
  Filled 2016-08-25: qty 20

## 2016-08-25 MED ORDER — NEOSTIGMINE METHYLSULFATE 10 MG/10ML IV SOLN
INTRAVENOUS | Status: AC
  Filled 2016-08-25: qty 1

## 2016-08-25 MED ORDER — SODIUM CHLORIDE 0.9% FLUSH
INTRAVENOUS | Status: AC
  Filled 2016-08-25: qty 10

## 2016-08-25 MED ORDER — CHLORHEXIDINE GLUCONATE CLOTH 2 % EX PADS: 6.0000 | MEDICATED_PAD | Freq: Once | CUTANEOUS | Status: DC

## 2016-08-25 MED ORDER — POVIDONE-IODINE 10 % EX OINT
TOPICAL_OINTMENT | CUTANEOUS | Status: AC
  Filled 2016-08-25: qty 1

## 2016-08-25 MED ORDER — DEXTROSE 5 % IV SOLN
2.0000 g | INTRAVENOUS | Status: DC
  Administered 2016-08-26: 2 g via INTRAVENOUS
  Filled 2016-08-25 (×4): qty 2

## 2016-08-25 MED ORDER — INSULIN ASPART 100 UNIT/ML ~~LOC~~ SOLN
0.0000 [IU] | Freq: Three times a day (TID) | SUBCUTANEOUS | Status: DC
  Administered 2016-08-25 (×2): 8 [IU] via SUBCUTANEOUS
  Administered 2016-08-26 (×2): 5 [IU] via SUBCUTANEOUS

## 2016-08-25 MED ORDER — ONDANSETRON HCL 4 MG/2ML IJ SOLN
INTRAMUSCULAR | Status: DC | PRN
  Administered 2016-08-25: 4 mg via INTRAVENOUS

## 2016-08-25 MED ORDER — LACTATED RINGERS IV SOLN
INTRAVENOUS | Status: DC
  Administered 2016-08-25 (×2): via INTRAVENOUS

## 2016-08-25 MED ORDER — NEOSTIGMINE METHYLSULFATE 10 MG/10ML IV SOLN
INTRAVENOUS | Status: DC | PRN
  Administered 2016-08-25: 3 mg via INTRAVENOUS

## 2016-08-25 MED ORDER — ACETAMINOPHEN 650 MG RE SUPP: 650.0000 mg | Freq: Four times a day (QID) | RECTAL | Status: DC | PRN

## 2016-08-25 MED ORDER — KETOROLAC TROMETHAMINE 30 MG/ML IJ SOLN
30.0000 mg | Freq: Once | INTRAMUSCULAR | Status: AC
  Administered 2016-08-25: 30 mg via INTRAVENOUS
  Filled 2016-08-25: qty 1

## 2016-08-25 MED ORDER — SIMETHICONE 80 MG PO CHEW: 40.0000 mg | CHEWABLE_TABLET | Freq: Four times a day (QID) | ORAL | Status: DC | PRN

## 2016-08-25 MED ORDER — ONDANSETRON HCL 4 MG/2ML IJ SOLN
4.0000 mg | Freq: Once | INTRAMUSCULAR | Status: AC
Start: 2016-08-25 — End: 2016-08-25
  Administered 2016-08-25: 4 mg via INTRAVENOUS
  Filled 2016-08-25: qty 2

## 2016-08-25 MED ORDER — MIDAZOLAM HCL 2 MG/2ML IJ SOLN
INTRAMUSCULAR | Status: AC
Start: 2016-08-25 — End: 2016-08-25
  Filled 2016-08-25: qty 2

## 2016-08-25 MED ORDER — HYDROMORPHONE HCL 1 MG/ML IJ SOLN
1.0000 mg | INTRAMUSCULAR | Status: DC | PRN
  Administered 2016-08-25: 1 mg via INTRAVENOUS
  Filled 2016-08-25: qty 1

## 2016-08-25 MED ORDER — ONDANSETRON HCL 4 MG/2ML IJ SOLN: 4.0000 mg | Freq: Four times a day (QID) | INTRAMUSCULAR | Status: DC | PRN

## 2016-08-25 MED ORDER — SUCCINYLCHOLINE 20MG/ML (10ML) SYRINGE FOR MEDFUSION PUMP - OPTIME
INTRAMUSCULAR | Status: DC | PRN
  Administered 2016-08-25: 120 mg via INTRAVENOUS

## 2016-08-25 MED ORDER — METRONIDAZOLE IN NACL 5-0.79 MG/ML-% IV SOLN
INTRAVENOUS | Status: AC
  Filled 2016-08-25: qty 100

## 2016-08-25 MED ORDER — ROCURONIUM 10MG/ML (10ML) SYRINGE FOR MEDFUSION PUMP - OPTIME
INTRAVENOUS | Status: DC | PRN
  Administered 2016-08-25: 20 mg via INTRAVENOUS
  Administered 2016-08-25 (×2): 5 mg via INTRAVENOUS

## 2016-08-25 MED ORDER — LIDOCAINE HCL (PF) 1 % IJ SOLN
INTRAMUSCULAR | Status: AC
  Filled 2016-08-25: qty 5

## 2016-08-25 MED ORDER — GLYCOPYRROLATE 0.2 MG/ML IJ SOLN
INTRAMUSCULAR | Status: DC | PRN
  Administered 2016-08-25: .5 mg via INTRAVENOUS

## 2016-08-25 MED ORDER — SUCCINYLCHOLINE CHLORIDE 20 MG/ML IJ SOLN
INTRAMUSCULAR | Status: AC
  Filled 2016-08-25: qty 1

## 2016-08-25 MED ORDER — HYDROMORPHONE HCL 1 MG/ML IJ SOLN
1.0000 mg | Freq: Once | INTRAMUSCULAR | Status: AC
  Administered 2016-08-25: 1 mg via INTRAVENOUS
  Filled 2016-08-25: qty 1

## 2016-08-25 MED ORDER — ROCURONIUM BROMIDE 50 MG/5ML IV SOLN
INTRAVENOUS | Status: AC
  Filled 2016-08-25: qty 1

## 2016-08-25 MED ORDER — HYDROMORPHONE HCL 1 MG/ML IJ SOLN
0.2500 mg | INTRAMUSCULAR | Status: DC | PRN
  Administered 2016-08-25: 0.5 mg via INTRAVENOUS
  Filled 2016-08-25: qty 0.5

## 2016-08-25 MED ORDER — CEFTRIAXONE SODIUM 1 G IJ SOLR
1.0000 g | Freq: Once | INTRAMUSCULAR | Status: AC
  Administered 2016-08-25: 1 g via INTRAVENOUS
  Filled 2016-08-25: qty 10

## 2016-08-25 MED ORDER — LORAZEPAM 2 MG/ML IJ SOLN: 1.0000 mg | INTRAMUSCULAR | Status: DC | PRN

## 2016-08-25 MED ORDER — IPRATROPIUM-ALBUTEROL 0.5-2.5 (3) MG/3ML IN SOLN: 3.0000 mL | Freq: Once | RESPIRATORY_TRACT | Status: DC

## 2016-08-25 MED ORDER — FENTANYL CITRATE (PF) 250 MCG/5ML IJ SOLN
INTRAMUSCULAR | Status: AC
  Filled 2016-08-25: qty 5

## 2016-08-25 MED ORDER — DIPHENHYDRAMINE HCL 25 MG PO CAPS: 25.0000 mg | ORAL_CAPSULE | Freq: Four times a day (QID) | ORAL | Status: DC | PRN

## 2016-08-25 MED ORDER — HYDROCODONE-ACETAMINOPHEN 5-325 MG PO TABS
1.0000 | ORAL_TABLET | ORAL | Status: DC | PRN
  Administered 2016-08-25 – 2016-08-26 (×3): 1 via ORAL
  Administered 2016-08-26 (×2): 2 via ORAL
  Filled 2016-08-25: qty 1
  Filled 2016-08-25: qty 2
  Filled 2016-08-25 (×2): qty 1
  Filled 2016-08-25: qty 2

## 2016-08-25 MED ORDER — GLYCOPYRROLATE 0.2 MG/ML IJ SOLN
INTRAMUSCULAR | Status: AC
  Filled 2016-08-25: qty 3

## 2016-08-25 MED ORDER — FENTANYL CITRATE (PF) 100 MCG/2ML IJ SOLN
INTRAMUSCULAR | Status: DC | PRN
  Administered 2016-08-25 (×5): 50 ug via INTRAVENOUS

## 2016-08-25 MED ORDER — ACETAMINOPHEN 325 MG PO TABS
650.0000 mg | ORAL_TABLET | Freq: Once | ORAL | Status: AC
  Administered 2016-08-25: 650 mg via ORAL
  Filled 2016-08-25: qty 2

## 2016-08-25 MED ORDER — PROPOFOL 10 MG/ML IV BOLUS
INTRAVENOUS | Status: DC | PRN
  Administered 2016-08-25: 150 mg via INTRAVENOUS

## 2016-08-25 MED ORDER — LIDOCAINE HCL (CARDIAC) 10 MG/ML IV SOLN
INTRAVENOUS | Status: DC | PRN
  Administered 2016-08-25: 50 mg via INTRAVENOUS

## 2016-08-25 MED ORDER — ACETAMINOPHEN 325 MG PO TABS
650.0000 mg | ORAL_TABLET | Freq: Four times a day (QID) | ORAL | Status: DC | PRN
  Administered 2016-08-26: 650 mg via ORAL
  Filled 2016-08-25: qty 2

## 2016-08-25 MED ORDER — BUPIVACAINE HCL (PF) 0.5 % IJ SOLN
INTRAMUSCULAR | Status: AC
  Filled 2016-08-25: qty 30

## 2016-08-25 MED ORDER — POVIDONE-IODINE 10 % OINT PACKET
TOPICAL_OINTMENT | CUTANEOUS | Status: DC | PRN
  Administered 2016-08-25: 1 via TOPICAL

## 2016-08-25 MED ORDER — BUPIVACAINE HCL (PF) 0.5 % IJ SOLN
INTRAMUSCULAR | Status: DC | PRN
  Administered 2016-08-25: 10 mL

## 2016-08-25 MED ORDER — MORPHINE SULFATE (PF) 2 MG/ML IV SOLN
INTRAVENOUS | Status: AC
  Administered 2016-08-25: 4 mg via INTRAVENOUS
  Filled 2016-08-25: qty 2

## 2016-08-25 MED ORDER — ONDANSETRON 4 MG PO TBDP: 4.0000 mg | ORAL_TABLET | Freq: Four times a day (QID) | ORAL | Status: DC | PRN

## 2016-08-25 MED ORDER — DIPHENHYDRAMINE HCL 50 MG/ML IJ SOLN: 25.0000 mg | Freq: Four times a day (QID) | INTRAMUSCULAR | Status: DC | PRN

## 2016-08-25 MED ORDER — DEXTROSE 5 % IV SOLN
1.0000 g | Freq: Once | INTRAVENOUS | Status: AC
  Administered 2016-08-25: 1 g via INTRAVENOUS
  Filled 2016-08-25: qty 10

## 2016-08-25 SURGICAL SUPPLY — 46 items
BAG HAMPER (MISCELLANEOUS) ×3 IMPLANT
BAG SPEC RTRVL LRG 6X4 10 (ENDOMECHANICALS) ×1
CHLORAPREP W/TINT 26ML (MISCELLANEOUS) ×3 IMPLANT
CLOTH BEACON ORANGE TIMEOUT ST (SAFETY) ×3 IMPLANT
COVER LIGHT HANDLE STERIS (MISCELLANEOUS) ×6 IMPLANT
CUTTER FLEX LINEAR 45M (STAPLE) ×3 IMPLANT
DECANTER SPIKE VIAL GLASS SM (MISCELLANEOUS) ×3 IMPLANT
ELECT REM PT RETURN 9FT ADLT (ELECTROSURGICAL) ×3
ELECTRODE REM PT RTRN 9FT ADLT (ELECTROSURGICAL) ×1 IMPLANT
EVACUATOR SMOKE 8.L (FILTER) ×3 IMPLANT
FORMALIN 10 PREFIL 120ML (MISCELLANEOUS) ×3 IMPLANT
GLOVE BIOGEL PI IND STRL 6.5 (GLOVE) IMPLANT
GLOVE BIOGEL PI IND STRL 7.0 (GLOVE) ×2 IMPLANT
GLOVE BIOGEL PI INDICATOR 6.5 (GLOVE) ×2
GLOVE BIOGEL PI INDICATOR 7.0 (GLOVE) ×4
GLOVE SURG SS PI 6.5 STRL IVOR (GLOVE) ×2 IMPLANT
GLOVE SURG SS PI 7.5 STRL IVOR (GLOVE) ×3 IMPLANT
GOWN STRL REUS W/ TWL XL LVL3 (GOWN DISPOSABLE) ×1 IMPLANT
GOWN STRL REUS W/TWL LRG LVL3 (GOWN DISPOSABLE) ×3 IMPLANT
GOWN STRL REUS W/TWL XL LVL3 (GOWN DISPOSABLE) ×3
INST SET LAPROSCOPIC AP (KITS) ×3 IMPLANT
KIT ROOM TURNOVER APOR (KITS) ×3 IMPLANT
MANIFOLD NEPTUNE II (INSTRUMENTS) ×3 IMPLANT
NDL INSUFFLATION 14GA 120MM (NEEDLE) ×1 IMPLANT
NEEDLE INSUFFLATION 14GA 120MM (NEEDLE) ×3 IMPLANT
NS IRRIG 1000ML POUR BTL (IV SOLUTION) ×3 IMPLANT
PACK LAP CHOLE LZT030E (CUSTOM PROCEDURE TRAY) ×3 IMPLANT
PAD ARMBOARD 7.5X6 YLW CONV (MISCELLANEOUS) ×3 IMPLANT
PENCIL HANDSWITCHING (ELECTRODE) ×3 IMPLANT
POUCH SPECIMEN RETRIEVAL 10MM (ENDOMECHANICALS) ×3 IMPLANT
RELOAD 45 VASCULAR/THIN (ENDOMECHANICALS) ×3 IMPLANT
RELOAD STAPLE 45 2.5 WHT GRN (ENDOMECHANICALS) IMPLANT
SET BASIN LINEN APH (SET/KITS/TRAYS/PACK) ×3 IMPLANT
SHEARS HARMONIC ACE PLUS 36CM (ENDOMECHANICALS) ×3 IMPLANT
SPONGE GAUZE 2X2 8PLY STER LF (GAUZE/BANDAGES/DRESSINGS) ×3
SPONGE GAUZE 2X2 8PLY STRL LF (GAUZE/BANDAGES/DRESSINGS) ×6 IMPLANT
STAPLER VISISTAT (STAPLE) ×3 IMPLANT
SUT VICRYL 0 UR6 27IN ABS (SUTURE) ×3 IMPLANT
TAPE CLOTH SURG 4X10 WHT LF (GAUZE/BANDAGES/DRESSINGS) ×2 IMPLANT
TRAY FOLEY CATH SILVER 16FR (SET/KITS/TRAYS/PACK) ×3 IMPLANT
TROCAR ENDO BLADELESS 11MM (ENDOMECHANICALS) ×3 IMPLANT
TROCAR ENDO BLADELESS 12MM (ENDOMECHANICALS) ×3 IMPLANT
TROCAR XCEL NON-BLD 5MMX100MML (ENDOMECHANICALS) ×3 IMPLANT
TUBING INSUFFLATION (TUBING) ×3 IMPLANT
WARMER LAPAROSCOPE (MISCELLANEOUS) ×3 IMPLANT
YANKAUER SUCT 12FT TUBE ARGYLE (SUCTIONS) ×3 IMPLANT

## 2016-08-25 NOTE — Anesthesia Postprocedure Evaluation (Signed)
Anesthesia Post Note  Patient: Ann Richards  Procedure(s) Performed: Procedure(s) (LRB): APPENDECTOMY LAPAROSCOPIC (N/A)  Patient location during evaluation: PACU Anesthesia Type: General Level of consciousness: awake and alert and oriented Pain management: pain level controlled Vital Signs Assessment: post-procedure vital signs reviewed and stable Respiratory status: spontaneous breathing Cardiovascular status: blood pressure returned to baseline Postop Assessment: no signs of nausea or vomiting Anesthetic complications: no     Last Vitals:  Vitals:   08/25/16 0915 08/25/16 0930  BP: 117/78 110/74  Pulse: (!) 115 (!) 117  Resp: (!) 27 (!) 21  Temp:      Last Pain:  Vitals:   08/25/16 0925  TempSrc:   PainSc: 3                  Kristin Lamagna

## 2016-08-25 NOTE — Addendum Note (Signed)
Addendum  created 08/25/16 1002 by Moshe SalisburyKaren E Rachel Rison, CRNA   Charge Capture section accepted

## 2016-08-25 NOTE — Anesthesia Preprocedure Evaluation (Addendum)
Anesthesia Evaluation  Patient identified by MRN, date of birth, ID band Patient awake    Reviewed: Allergy & Precautions, NPO status , Patient's Chart, lab work & pertinent test results  Airway   TM Distance: >3 FB Neck ROM: Full    Dental  (+) Teeth Intact   Pulmonary former smoker,  Sinus congestion   breath sounds clear to auscultation       Cardiovascular Exercise Tolerance: Good  Rhythm:Regular Rate:Normal     Neuro/Psych Anxiety Depression    GI/Hepatic   Endo/Other  diabetesOral hypoglycemics only but not taking now.told she is "borderline" diabetic.  Renal/GU      Musculoskeletal   Abdominal (+) + obese,  Abdomen: soft. Bowel sounds: normal.  Peds  Hematology   Anesthesia Other Findings   Reproductive/Obstetrics                            Anesthesia Physical Anesthesia Plan  ASA: II and emergent  Anesthesia Plan: General   Post-op Pain Management:    Induction: Cricoid pressure planned, Rapid sequence and Intravenous  Airway Management Planned: Oral ETT  Additional Equipment:   Intra-op Plan:   Post-operative Plan: Extubation in OR  Informed Consent:   Plan Discussed with: CRNA, Surgeon and Anesthesiologist  Anesthesia Plan Comments:         Anesthesia Quick Evaluation

## 2016-08-25 NOTE — ED Notes (Signed)
Report given to Bree RN 

## 2016-08-25 NOTE — Op Note (Signed)
Patient:  Ann Richards  DOB:  06-May-1991  MRN:  [id number]   Preop Diagnosis:  Acute appendicitis  Postop Diagnosis:  Same  Procedure:  Laparoscopic appendectomy  Surgeon:  Franky MachoMark Euleta Belson, M.D.  Anes:  Gen. endotracheal  Indications:  Patient is a 26 year old black female who presented to the emergency room with a 24-hour history of worsening lower abdominal pain. CT scan of the abdomen revealed acute appendicitis. The patient now comes to the operating room for laparoscopic appendectomy. The risks and benefits of the procedure including bleeding, infection, and the possibility of an open procedure were fully explained to the patient, who gave informed consent.  Procedure note:  The patient was placed in the supine position. After induction of general endotracheal anesthesia, the abdomen was prepped and draped using the usual sterile technique with DuraPrep. Surgical site confirmation was performed.  A supraumbilical incision was made down to the fascia. A Veress needle was introduced into the abdominal cavity and confirmation of placement was done using the saline drop test. The abdomen was then insufflated to 16 mmHg pressure. An 11 mm trocar was introduced into the abdominal cavity under direct visualization without difficulty. The patient was placed in deeper Trendelenburg position and an additional 12 mm trocar was placed in the suprapubic region and a 5 mm trocar was placed in left lower quadrant region. The appendix was visualized and noted to be elongated and diffusely inflamed. The mesoappendix was divided using the Harmonic scalpel. A vascular Endo GIA was placed across the base the appendix and fired. The appendix was then removed using an Endo Catch bag without difficulty. The staple line was inspected and noted to be within normal limits. All fluid and air were then evacuated from the abdominal cavity prior to the removal of the trochars.  All wounds were irrigated with  normal saline. All wounds were injected with 0.5% Sensorcaine. The supraumbilical fascia as well as suprapubic fascia were reapproximated using 0 Vicryl interrupted sutures. All skin incisions were closed using staples. Betadine ointment and dry sterile dressings were applied.  All tape and needle counts were correct at the end of the procedure. The patient was extubated in the operating room and transferred to PACU in stable condition.   Complications:  None  EBL:  Minimal  Specimen:  Appendix

## 2016-08-25 NOTE — Transfer of Care (Signed)
Immediate Anesthesia Transfer of Care Note  Patient: Ann Richards  Procedure(s) Performed: Procedure(s): APPENDECTOMY LAPAROSCOPIC (N/A)  Patient Location: PACU  Anesthesia Type:General  Level of Consciousness: awake, alert  and oriented  Airway & Oxygen Therapy: Patient Spontanous Breathing and Patient connected to face mask oxygen  Post-op Assessment: Report given to RN  Post vital signs: Reviewed and stable  Last Vitals:  Vitals:   08/25/16 0727 08/25/16 0736  BP: 125/82 129/82  Pulse: (!) 120 (!) 115  Resp: 18 18  Temp: 37.3 C     Last Pain:  Vitals:   08/25/16 0727  TempSrc: Oral  PainSc: 3       Patients Stated Pain Goal: 5 (08/25/16 0727)  Complications: No apparent anesthesia complications

## 2016-08-25 NOTE — Progress Notes (Addendum)
Inpatient Diabetes Program Recommendations  AACE/ADA: New Consensus Statement on Inpatient Glycemic Control (2015)  Target Ranges:  Prepandial:   less than 140 mg/dL      Peak postprandial:   less than 180 mg/dL (1-2 hours)      Critically ill patients:  140 - 180 mg/dL   Results for Herschel SenegalSTUBBLEFIELD, Syesha L (MRN [id number]) as of 08/25/2016 08:18  Ref. Range 08/25/2016 07:34  Glucose-Capillary Latest Ref Range: 65 - 99 mg/dL 409282 (H)  Results for Herschel SenegalSTUBBLEFIELD, Yavonne L (MRN [id number]) as of 08/25/2016 08:18  Ref. Range 08/24/2016 23:28  Glucose Latest Ref Range: 65 - 99 mg/dL 956226 (H)   Review of Glycemic Control  Diabetes history: No Outpatient Diabetes medications: NA Current orders for Inpatient glycemic control: None  Inpatient Diabetes Program Recommendations: Correction (SSI): If patient is admitted, please consider ordering CBGs with Novolog correction scale ACHS. HgbA1C: Please add on an A1C to blood in lab to evaluate glycemic control over the past 2-3 months.  NOTE: No DM history noted in chart. Initial lab glucose was 226 mg/dl and finger stick noted to be 282 mg/dl this morning at 2:137:34 am. Please add on an A1C to blood in lab and if admitted recommend ordering CBGs with Novolog correction scale.   Addendum 08/25/16@13 :17-Spoke with patient to inquire about any DM history. Patient reports that she has been told she had borderline DM in the past and she was taking Metformin and her PCP took her off the Metformin years ago. Patient reports that her blood glucose or A1C has not been check in "quite awhile". Discussed noted hyperglycemia and informed patient that an A1C has been ordered along with CBGs and Novolog correction scale ACHS. Patient reports that she currently does not have any insurance and will not be eligable to sign up for insurance at work for at least a few months (just started a new job recently) and she can not afford to go to see Dr. Gerda DissLuking for follow up. Informed  patient a consult for CM would be ordered to request assistance with follow up. Will need to keep cost in mind if patient is diagnosed with DM and prescribed DM medications. Patient verbalized understanding of information and she states that she has no questions at this time. Will recheck chart for A1C results in the morning and make recommendations if needed.  Thanks, Orlando PennerMarie Mulki Roesler, RN, MSN, CDE Diabetes Coordinator Inpatient Diabetes Program [phone] (Team Pager from 8am to 5pm)

## 2016-08-25 NOTE — Anesthesia Procedure Notes (Addendum)
Procedure Name: Intubation Date/Time: 08/25/2016 7:55 AM Performed by: Tressie Stalker E Pre-anesthesia Checklist: Patient identified, Patient being monitored, Timeout performed, Emergency Drugs available and Suction available Patient Re-evaluated:Patient Re-evaluated prior to inductionOxygen Delivery Method: Circle system utilized Preoxygenation: Pre-oxygenation with 100% oxygen Intubation Type: IV induction, Rapid sequence and Cricoid Pressure applied Ventilation: Mask ventilation without difficulty Laryngoscope Size: Mac and 3 Grade View: Grade I Tube type: Oral Tube size: 7.0 mm Number of attempts: 1 Airway Equipment and Method: Stylet Placement Confirmation: ETT inserted through vocal cords under direct vision,  positive ETCO2 and breath sounds checked- equal and bilateral Secured at: 21 cm Tube secured with: Tape Dental Injury: Teeth and Oropharynx as per pre-operative assessment

## 2016-08-25 NOTE — H&P (Signed)
Ann Richards is an [age] y.o. female.   Chief Complaint: Abdominal pain HPI: Patient is a 26 year old white female who presented to the emergency room with a 24-hour history of worsening lower abdominal pain. She did have one episode of emesis. Her appetite is decreased. She denies any fever or chills. CT scan of the abdomen reveals acute appendicitis.  History reviewed. No pertinent past medical history.  History reviewed. No pertinent surgical history.  No family history on file. Social History:  reports that she quit smoking about 7 years ago. Her smoking use included Cigarettes. She started smoking about 8 years ago. She has a 0.25 pack-year smoking history. She has never used smokeless tobacco. She reports that she drinks alcohol. She reports that she does not use drugs.  Allergies: No Known Allergies  Medications Prior to Admission  Medication Sig Dispense Refill  . ALPRAZolam (XANAX) 0.5 MG tablet TAKE ONE TABLET BY MOUTH ONCE DAILY AS NEEDED FOR ANXIETY/INSOMNIA. USE SPARINGLY 20 tablet 0  . escitalopram (LEXAPRO) 10 MG tablet Take 1 tablet (10 mg total) by mouth daily. 30 tablet 2  . etonogestrel (IMPLANON) 68 MG IMPL implant 1 each by Subdermal route once. Reported on 08/04/2015    . Multiple Vitamins-Minerals (MULTIVITAMIN WITH MINERALS) tablet Take 1 tablet by mouth daily. Reported on 08/04/2015    . VENTOLIN HFA 108 (90 Base) MCG/ACT inhaler INHALE TWO PUFFS INTO THE LUNGS EVERY 4 HOURS AS NEEDED 18 each 0  . azithromycin (ZITHROMAX Z-PAK) 250 MG tablet Take 2 tablets (500 mg) on  Day 1,  followed by 1 tablet (250 mg) once daily on Days 2 through 5. 6 each 0  . methylPREDNISolone (MEDROL) 4 MG tablet 1 bid for 3 days then 1 qd for 3d 9 tablet 0    Results for orders placed or performed during the hospital encounter of 08/25/16 (from the past 48 hour(s))  Lipase, blood     Status: Abnormal   Collection Time: 08/24/16 11:28 PM  Result Value Ref Range   Lipase <10 (L) 11  - 51 U/L  Comprehensive metabolic panel     Status: Abnormal   Collection Time: 08/24/16 11:28 PM  Result Value Ref Range   Sodium 133 (L) 135 - 145 mmol/L   Potassium 3.2 (L) 3.5 - 5.1 mmol/L   Chloride 99 (L) 101 - 111 mmol/L   CO2 22 22 - 32 mmol/L   Glucose, Bld 226 (H) 65 - 99 mg/dL   BUN 7 6 - 20 mg/dL   Creatinine, Ser 0.57 0.44 - 1.00 mg/dL   Calcium 8.9 8.9 - 10.3 mg/dL   Total Protein 8.0 6.5 - 8.1 g/dL   Albumin 3.9 3.5 - 5.0 g/dL   AST 15 15 - 41 U/L   ALT 19 14 - 54 U/L   Alkaline Phosphatase 87 38 - 126 U/L   Total Bilirubin 1.3 (H) 0.3 - 1.2 mg/dL   GFR calc non Af Amer >60 >60 mL/min   GFR calc Af Amer >60 >60 mL/min    Comment: (NOTE) The eGFR has been calculated using the CKD EPI equation. This calculation has not been validated in all clinical situations. eGFR's persistently <60 mL/min signify possible Chronic Kidney Disease.    Anion gap 12 5 - 15  CBC     Status: Abnormal   Collection Time: 08/24/16 11:28 PM  Result Value Ref Range   WBC 24.1 (H) 4.0 - 10.5 K/uL   RBC 5.07 3.87 - 5.11 MIL/uL  Hemoglobin 13.5 12.0 - 15.0 g/dL   HCT 41.0 36.0 - 46.0 %   MCV 80.9 78.0 - 100.0 fL   MCH 26.6 26.0 - 34.0 pg   MCHC 32.9 30.0 - 36.0 g/dL   RDW 13.5 11.5 - 15.5 %   Platelets 254 150 - 400 K/uL  Urinalysis, Routine w reflex microscopic     Status: Abnormal   Collection Time: 08/25/16 12:45 AM  Result Value Ref Range   Color, Urine AMBER (A) YELLOW    Comment: BIOCHEMICALS MAY BE AFFECTED BY COLOR   APPearance HAZY (A) CLEAR   Specific Gravity, Urine 1.030 1.005 - 1.030   pH 5.0 5.0 - 8.0   Glucose, UA 150 (A) NEGATIVE mg/dL   Hgb urine dipstick NEGATIVE NEGATIVE   Bilirubin Urine SMALL (A) NEGATIVE   Ketones, ur 80 (A) NEGATIVE mg/dL   Protein, ur 100 (A) NEGATIVE mg/dL   Nitrite NEGATIVE NEGATIVE   Leukocytes, UA MODERATE (A) NEGATIVE   RBC / HPF 6-30 0 - 5 RBC/hpf   WBC, UA TOO NUMEROUS TO COUNT 0 - 5 WBC/hpf   Bacteria, UA RARE (A) NONE SEEN    Mucous PRESENT    Hyaline Casts, UA PRESENT   POC urine preg, ED     Status: None   Collection Time: 08/25/16 12:46 AM  Result Value Ref Range   Preg Test, Ur NEGATIVE NEGATIVE    Comment:        THE SENSITIVITY OF THIS METHODOLOGY IS >24 mIU/mL   I-Stat beta hCG blood, ED ([facility], [facility], [facility] only)     Status: None   Collection Time: 08/25/16  2:03 AM  Result Value Ref Range   I-stat hCG, quantitative <5.0 <5 mIU/mL   Comment 3            Comment:   GEST. AGE      CONC.  (mIU/mL)   <=1 WEEK        5 - 50     2 WEEKS       50 - 500     3 WEEKS       100 - 10,000     4 WEEKS     1,000 - 30,000        FEMALE AND NON-PREGNANT FEMALE:     LESS THAN 5 mIU/mL   I-Stat CG4 Lactic Acid, ED     Status: None   Collection Time: 08/25/16  3:02 AM  Result Value Ref Range   Lactic Acid, Venous 1.63 0.5 - 1.9 mmol/L   Ct Abdomen Pelvis W Contrast  Result Date: 08/25/2016 CLINICAL DATA:  Gradually worsening lower abdominal pain, onset this morning. EXAM: CT ABDOMEN AND PELVIS WITH CONTRAST TECHNIQUE: Multidetector CT imaging of the abdomen and pelvis was performed using the standard protocol following bolus administration of intravenous contrast. CONTRAST:  50m ISOVUE-300 IOPAMIDOL (ISOVUE-300) INJECTION 61%, 105mISOVUE-300 IOPAMIDOL (ISOVUE-300) INJECTION 61% COMPARISON:  09/11/2010 FINDINGS: Lower chest: No acute abnormality. Hepatobiliary: No focal liver abnormality is seen. No gallstones, gallbladder wall thickening, or biliary dilatation. Pancreas: Unremarkable. No pancreatic ductal dilatation or surrounding inflammatory changes. Spleen: Normal in size without focal abnormality. Adrenals/Urinary Tract: Adrenal glands are unremarkable. Kidneys are normal, without renal calculi, focal lesion, or hydronephrosis. Bladder is unremarkable. Stomach/Bowel: There is enlargement and mural thickening of the appendix, with appendicoliths. However, the periappendiceal fat is not prominently inflamed. There is no  appendiceal abscess or abnormal fluid collection. The appendix appeared normal on 09/11/2010. Remainder of the  bowel is unremarkable. Enteric contrast has reached the rectum. Vascular/Lymphatic: No significant vascular findings are present. No enlarged abdominal or pelvic lymph nodes. Reproductive: Uterus and bilateral adnexa are unremarkable. Other: No ascites Musculoskeletal: No significant skeletal lesion IMPRESSION: 1. Enlargement and mural thickening of the appendix, new from 2012. Probable mild or early appendicitis. 2. No other acute findings in the abdomen or pelvis. 3. These results were called by telephone at the time of interpretation on 08/25/2016 at 3:09 am to Dr. Ezequiel Essex , who verbally acknowledged these results. Electronically Signed   By: Andreas Newport M.D.   On: 08/25/2016 03:09    Review of Systems  Constitutional: Positive for malaise/fatigue.  HENT: Negative.   Eyes: Negative.   Respiratory: Negative.   Cardiovascular: Negative.   Gastrointestinal: Positive for abdominal pain and nausea. Negative for diarrhea.  Genitourinary: Negative.   Musculoskeletal: Negative.   Skin: Negative.   Neurological: Negative.   Endo/Heme/Allergies: Negative.   Psychiatric/Behavioral: Negative.     Blood pressure 126/69, pulse (!) 125, temperature 98.8 F (37.1 C), temperature source Oral, resp. rate 20, height '5\' 1"'  (1.549 m), weight 163 lb 14.4 oz (74.3 kg), last menstrual period 08/18/2016, SpO2 95 %. Physical Exam  Vitals reviewed. Constitutional: She is oriented to person, place, and time. She appears well-developed and well-nourished.  HENT:  Head: Normocephalic and atraumatic.  Neck: Normal range of motion. Neck supple.  Cardiovascular: Normal rate, regular rhythm and normal heart sounds.   Respiratory: Effort normal and breath sounds normal.  GI: Soft. She exhibits no distension. There is tenderness. There is no rebound.  Tender in the right lower quadrant palpation. No  rigidity noted.  Neurological: She is alert and oriented to person, place, and time.  Skin: Skin is warm and dry.     Assessment/Plan Impression: Acute appendicitis, possible UTI Plan: Patient be taken to the operating room for laparoscopic appendectomy. The risks and benefits of the procedure including bleeding, infection, and the possibility of an open procedure were fully explained to the patient, who gave informed consent.  Aviva Signs, MD 08/25/2016, 7:07 AM

## 2016-08-25 NOTE — ED Provider Notes (Signed)
[facility]-EMERGENCY DEPT Provider Note   CSN: [id number] Arrival date & time: 08/24/16  2234  By signing my name below, I, Ann Richards, attest that this documentation has been prepared under the direction and in the presence of Ann OctaveStephen Tahsin Benyo, MD. Electronically Signed: Alyssa GroveMartin Richards, ED Scribe. 08/25/16. 1:00 AM.   History   Chief Complaint Chief Complaint  Patient presents with  . Abdominal Pain   The history is provided by the patient. No language interpreter was used.   HPI Comments: Ann Richards is a 26 y.o. female who presents to the Emergency Department complaining of gradual onset, constant, unchanged, 10/10, sharp, lower abdominal pain beginning this morning upon waking. Exacerbated with movement. No alleviating factors. No hx of similar pain. Pt reports associated (pressure sensation) dysuria, subjective fever, abnormal vaginal spotting and vomiting. She had x1 episode of vomiting due to severity of pain. She denies significant PMHx or PSHx but notes she was on Glucophage for borderline DM, but stopped taking it a while ago. Last normal bowel movement was today. LNMP ended 2 days ago. She denies chance of pregnancy. Pt denies abnormal vaginal discharge or any other complaints at this time. NKDA.    History reviewed. No pertinent past medical history.  Patient Active Problem List   Diagnosis Date Noted  . Generalized anxiety disorder 11/16/2015  . Depression 11/16/2015    History reviewed. No pertinent surgical history.  OB History    No data available       Home Medications    Prior to Admission medications   Medication Sig Start Date End Date Taking? Authorizing Provider  ALPRAZolam Prudy Feeler(XANAX) 0.5 MG tablet TAKE ONE TABLET BY MOUTH ONCE DAILY AS NEEDED FOR ANXIETY/INSOMNIA. USE SPARINGLY 12/15/15  Yes Ann AlbertWilliam S Luking, MD  escitalopram (LEXAPRO) 10 MG tablet Take 1 tablet (10 mg total) by mouth daily. 11/12/15  Yes Ann AlbertWilliam S Luking, MD  etonogestrel (IMPLANON)  68 MG IMPL implant 1 each by Subdermal route once. Reported on 08/04/2015   Yes Historical Provider, MD  Multiple Vitamins-Minerals (MULTIVITAMIN WITH MINERALS) tablet Take 1 tablet by mouth daily. Reported on 08/04/2015   Yes Historical Provider, MD  VENTOLIN HFA 108 (90 Base) MCG/ACT inhaler INHALE TWO PUFFS INTO THE LUNGS EVERY 4 HOURS AS NEEDED 03/08/16  Yes Ann AlbertWilliam S Luking, MD  azithromycin (ZITHROMAX Z-PAK) 250 MG tablet Take 2 tablets (500 mg) on  Day 1,  followed by 1 tablet (250 mg) once daily on Days 2 through 5. 03/09/16   Babs SciaraScott A Luking, MD  methylPREDNISolone (MEDROL) 4 MG tablet 1 bid for 3 days then 1 qd for 3d 03/09/16   Babs SciaraScott A Luking, MD    Family History No family history on file.  Social History Social History  Substance Use Topics  . Smoking status: Former Smoker    Packs/day: 0.25    Years: 1.00    Types: Cigarettes    Start date: 03/14/2008    Quit date: 05/14/2009  . Smokeless tobacco: Never Used  . Alcohol use No     Allergies   Patient has no known allergies.   Review of Systems Review of Systems A complete 10 system review of systems was obtained and all systems are negative except as noted in the HPI and PMH.    Physical Exam Updated Vital Signs BP 126/62 (BP Location: Right Arm)   Pulse (!) 125   Temp 99.6 F (37.6 C) (Oral)   Resp 15   Ht 5\' 1"  (1.549 m)  Wt 150 lb (68 kg)   LMP 08/18/2016   SpO2 100%   BMI 28.34 kg/m   Physical Exam  Constitutional: She is oriented to person, place, and time. She appears well-developed and well-nourished.  Uncomfortable  HENT:  Head: Normocephalic and atraumatic.  Mouth/Throat: Oropharynx is clear and moist. No oropharyngeal exudate.  Eyes: Conjunctivae and EOM are normal. Pupils are equal, round, and reactive to light.  Neck: Normal range of motion. Neck supple.  No meningismus.  Cardiovascular: Normal rate, normal heart sounds and intact distal pulses.   No murmur heard. tachycardic    Pulmonary/Chest: Effort normal and breath sounds normal. No respiratory distress.  Abdominal: Soft. There is tenderness. There is guarding. There is no rebound.  Suprapubic and RLQ tenderness with voluntary guarding No CVA tenderness  Musculoskeletal: Normal range of motion. She exhibits no edema or tenderness.  Neurological: She is alert and oriented to person, place, and time. No cranial nerve deficit. She exhibits normal muscle tone. Coordination normal.  No ataxia on finger to nose bilaterally. No pronator drift. 5/5 strength throughout. CN 2-12 intact.Equal grip strength. Sensation intact.   Skin: Skin is warm.  Psychiatric: She has a normal mood and affect. Her behavior is normal.  Nursing note and vitals reviewed.   ED Treatments / Results  DIAGNOSTIC STUDIES: Oxygen Saturation is 100% on RA, normal by my interpretation.    COORDINATION OF CARE: 12:58 AM Discussed treatment plan with pt at bedside which includes CT Abdomen and blood work and pt agreed to plan.  Labs (all labs ordered are listed, but only abnormal results are displayed) Labs Reviewed  LIPASE, BLOOD - Abnormal; Notable for the following:       Result Value   Lipase <10 (*)    All other components within normal limits  COMPREHENSIVE METABOLIC PANEL - Abnormal; Notable for the following:    Sodium 133 (*)    Potassium 3.2 (*)    Chloride 99 (*)    Glucose, Bld 226 (*)    Total Bilirubin 1.3 (*)    All other components within normal limits  CBC - Abnormal; Notable for the following:    WBC 24.1 (*)    All other components within normal limits  URINALYSIS, ROUTINE W REFLEX MICROSCOPIC - Abnormal; Notable for the following:    Color, Urine AMBER (*)    APPearance HAZY (*)    Glucose, UA 150 (*)    Bilirubin Urine SMALL (*)    Ketones, ur 80 (*)    Protein, ur 100 (*)    Leukocytes, UA MODERATE (*)    Bacteria, UA RARE (*)    All other components within normal limits  URINE CULTURE  POC URINE PREG, ED   I-STAT CG4 LACTIC ACID, ED  I-STAT BETA HCG BLOOD, ED ([facility], [facility], [facility] ONLY)    EKG  EKG Interpretation None       Radiology Ct Abdomen Pelvis W Contrast  Result Date: 08/25/2016 CLINICAL DATA:  Gradually worsening lower abdominal pain, onset this morning. EXAM: CT ABDOMEN AND PELVIS WITH CONTRAST TECHNIQUE: Multidetector CT imaging of the abdomen and pelvis was performed using the standard protocol following bolus administration of intravenous contrast. CONTRAST:  30mL ISOVUE-300 IOPAMIDOL (ISOVUE-300) INJECTION 61%, ISOVUE-300 IOPAMIDOL (ISOVUE-300) INJECTION 61% COMPARISON:  09/11/2010 FINDINGS: Lower chest: No acute abnormality. Hepatobiliary: No focal liver abnormality is seen. No gallstones, gallbladder wall thickening, or biliary dilatation. Pancreas: Unremarkable. No pancreatic ductal dilatation or surrounding inflammatory changes. Spleen: Normal in size without  focal abnormality. Adrenals/Urinary Tract: Adrenal glands are unremarkable. Kidneys are normal, without renal calculi, focal lesion, or hydronephrosis. Bladder is unremarkable. Stomach/Bowel: There is enlargement and mural thickening of the appendix, with appendicoliths. However, the periappendiceal fat is not prominently inflamed. There is no appendiceal abscess or abnormal fluid collection. The appendix appeared normal on 09/11/2010. Remainder of the bowel is unremarkable. Enteric contrast has reached the rectum. Vascular/Lymphatic: No significant vascular findings are present. No enlarged abdominal or pelvic lymph nodes. Reproductive: Uterus and bilateral adnexa are unremarkable. Other: No ascites Musculoskeletal: No significant skeletal lesion IMPRESSION: 1. Enlargement and mural thickening of the appendix, new from 2012. Probable mild or early appendicitis. 2. No other acute findings in the abdomen or pelvis. 3. These results were called by telephone at the time of interpretation on 08/25/2016 at 3:09 am to Dr. Glynn Octave ,  who verbally acknowledged these results. Electronically Signed   By: Ellery Plunk M.D.   On: 08/25/2016 03:09    Procedures Procedures (including critical care time)  Medications Ordered in ED Medications - No data to display   Initial Impression / Assessment and Plan / ED Course  I have reviewed the triage vital signs and the nursing notes.  Pertinent labs & imaging results that were available during my care of the patient were reviewed by me and considered in my medical decision making (see chart for details).    Patient with lower abdominal pain with vomiting since early this morning. She is febrile and tachycardic on arrival.  Exam is concerning for right lower quadrant and suprapubic pain with voluntary guarding. Labs show leukocytosis. Patient meets sepsis criteria with tachycardia, fever and leukocytosis. She is given IV fluids as well as broad-spectrum antibiotics for presumed appendicitis. Urinalysis also has many white blood cells and ketones.  Hyperglycemia with normal anion gap.  Patient has been told "prediabetic" in past. Does not appear to be in DKA. Bicarb 22.  CT is suspicious for early appendicitis. Patient given IV Rocephin and Flagyl. Discussed with Dr. Lovell Sheehan of surgery who will admit patient. She remains tachycardic and will be given another liter of fluid. Lactate is normal.  CRITICAL CARE Performed by: Ann Octave Total critical care time: 35 minutes Critical care time was exclusive of separately billable procedures and treating other patients. Critical care was necessary to treat or prevent imminent or life-threatening deterioration. Critical care was time spent personally by me on the following activities: development of treatment plan with patient and/or surrogate as well as nursing, discussions with consultants, evaluation of patient's response to treatment, examination of patient, obtaining history from patient or surrogate, ordering and performing  treatments and interventions, ordering and review of laboratory studies, ordering and review of radiographic studies, pulse oximetry and re-evaluation of patient's condition.   Final Clinical Impressions(s) / ED Diagnoses   Final diagnoses:  Acute appendicitis with localized peritonitis  Sepsis, due to unspecified organism [facility])    New Prescriptions New Prescriptions   No medications on file    I personally performed the services described in this documentation, which was scribed in my presence. The recorded information has been reviewed and is accurate.    Ann Octave, MD 08/25/16 [phone]

## 2016-08-26 ENCOUNTER — Encounter (HOSPITAL_COMMUNITY): Payer: Self-pay | Admitting: General Surgery

## 2016-08-26 LAB — BASIC METABOLIC PANEL
Anion gap: 10 (ref 5–15)
BUN: 6 mg/dL (ref 6–20)
CO2: 21 mmol/L — ABNORMAL LOW (ref 22–32)
Calcium: 8 mg/dL — ABNORMAL LOW (ref 8.9–10.3)
Chloride: 103 mmol/L (ref 101–111)
Creatinine, Ser: 0.43 mg/dL — ABNORMAL LOW (ref 0.44–1.00)
GFR calc Af Amer: >60 mL/min (ref >60)
GFR calc non Af Amer: >60 mL/min (ref >60)
Glucose, Bld: 195 mg/dL — ABNORMAL HIGH (ref 65–99)
Potassium: 3 mmol/L — ABNORMAL LOW (ref 3.5–5.1)
SODIUM: 134 mmol/L — AB (ref 135–145)

## 2016-08-26 LAB — HEMOGLOBIN A1C
HEMOGLOBIN A1C: 12.2 % — AB (ref 4.8–5.6)
Mean Plasma Glucose: 303 mg/dL

## 2016-08-26 LAB — GLUCOSE, CAPILLARY
GLUCOSE-CAPILLARY: 209 mg/dL — AB (ref 65–99)
Glucose-Capillary: 219 mg/dL — ABNORMAL HIGH (ref 65–99)

## 2016-08-26 LAB — CBC
HCT: 35.6 % — ABNORMAL LOW (ref 36.0–46.0)
HEMOGLOBIN: 11.6 g/dL — AB (ref 12.0–15.0)
MCH: 26.7 pg (ref 26.0–34.0)
MCHC: 32.6 g/dL (ref 30.0–36.0)
MCV: 82 fL (ref 78.0–100.0)
Platelets: 233 10*3/uL (ref 150–400)
RBC: 4.34 MIL/uL (ref 3.87–5.11)
RDW: 13.7 % (ref 11.5–15.5)
WBC: 14.9 10*3/uL — ABNORMAL HIGH (ref 4.0–10.5)

## 2016-08-26 LAB — URINE CULTURE

## 2016-08-26 MED ORDER — METFORMIN HCL 1000 MG PO TABS: 1000.0000 mg | ORAL_TABLET | Freq: Two times a day (BID) | ORAL | 1 refills | Status: DC

## 2016-08-26 MED ORDER — CIPROFLOXACIN HCL 500 MG PO TABS: 500.0000 mg | ORAL_TABLET | Freq: Two times a day (BID) | ORAL | 0 refills | Status: DC

## 2016-08-26 MED ORDER — HYDROCODONE-ACETAMINOPHEN 5-325 MG PO TABS: 1.0000 | ORAL_TABLET | ORAL | 0 refills | Status: DC | PRN

## 2016-08-26 MED ORDER — GLIPIZIDE 5 MG PO TABS: 2.5000 mg | ORAL_TABLET | Freq: Two times a day (BID) | ORAL | 1 refills | Status: DC

## 2016-08-26 MED ORDER — LIVING WELL WITH DIABETES BOOK
Freq: Once | Status: AC
  Administered 2016-08-26: 13:00:00
  Filled 2016-08-26: qty 1

## 2016-08-26 NOTE — Plan of Care (Signed)
Problem: Food- and Nutrition-Related Knowledge Deficit (NB-1.1) Goal: Nutrition education Formal process to instruct or train a patient/client in a skill or to impart knowledge to help patients/clients voluntarily manage or modify food choices and eating behavior to maintain or improve health. Outcome: Adequate for Discharge  RD consulted for nutrition education regarding diabetes.   Lab Results  Component Value Date   HGBA1C 12.2 (H) 08/24/2016    RD provided "Type 2 Diabetes nutrition therapy" and "Diabetes label reading tips" handouts from the Academy of [employer]etics as well as a copy of the My Plate template for diabetics.  Went through dietary recall: Breakfast: Skips Lunch: Skips First meal at 3pm-4pm: Fast food [employer] chicken fingers) or will just "snack until dinner" Dinner: Meat, starch, vegetable (did not know potatoes/corn werent vegetables) Beverages: Water, juice >/= 1 cup per day, reg soda (not necessarily every day) Other: Admits to eating large amount of potatoes + corn. Recently has been extremely thirsty and craving sugar. Has been getting up in middle of night to chug soda- hopefully habit will disappear once BG better controlled.   She began by stating she knows she needs to stop skipping meals. DM coordinator stated she had to with the medication she was to be placed on. Agreed with this statement and discussed importance of controlled/consistent carbohydrate intake throughout the day. Explained how skipping meals affects BG.   Listed the different food groups and their effects on blood sugar. RD listed the starchy vegetables and told how these are not "true vegetables". She was unaware of this and has been eating potatoes and corn frequently.  Showed picture of the "My Plate" and the best ratio of food groups at each meal for optimal glycemic control.   Placed emphasis on her habit of consuming sugary beverages. She is drinking juice daily and sounds to be  drinking soda very often. Explained carb content wise, juice is no different from soda. Sugary beverages are one of easiest ways to elevate BG and these should be reduced or eliminated. RD listed appropriate beverages such as diet soda, unsweet tea, coffee (with minimal cream/sugar), water, milk, Crystal light/mio flavor packets, or even low sugar sports drinks such as G2/propel would be superior choices to soda or juice. She asked about diet juice. While RD doubts there is such thing, if she finds a juice that is 0 g Carb, that should be ok. Mentioned that No sugar added (believe is what she was thinking of) does not mean the same as sugar free.   RD went over carb counting. Explained how every "serving" is 15 g Carb and every food has its own "serving size". Explained idea of carb density and how some foods have a smaller serving size due to their density. We went through a couple examples of counting carbs with specific foods.   She asked about including regular soda at a meal. RD stated that if she counted these as part of her 80 g carb and still tried to make 75% of her meal come from veg/protein, then this would be ok.   Teach back method used.  Summary of recommendation  1. Do not skip meal! Eat 3x daily with an appropriate snack or two.  2. Reduce/eliminate soda/juice.  3. Reduce amount of potatoes and corn consumed   4. 15-30 g Carb snack, ~80g carb per meal. Carbs 25% of meal  Expect poor-fair compliance. Pt frequently interrupted RD to discuss off topic foods she was consuming. She was set on  foods either "can have" or "cant have" and did not seem to grasp that are no foods she cannot have; it is much more about portion size/frequency. Hopefully with more reinforcement this will become clear. Attempted to motivate her by vastly changing her diet may reduce or eliminate her dependence on medications.   Body mass index is 30.8 kg/m. Pt meets criteria for obese based on current BMI.   No  further nutrition interventions warranted at this time. RD provided contact information for outpatient education [facility]. She says she does not have insurance, but will follow up at Health center. Encouraged her to keep this appointment.  If additional nutrition issues arise, please re-consult RD.  Christophe LouisNathan Franks RD, LDN, CNSC Clinical Nutrition Pager: [phone] 08/26/2016 1:42 PM

## 2016-08-26 NOTE — Care Management Note (Addendum)
Case Management Note  Patient Details  Name: Ann Richards MRN: [id number] Date of Birth: 1991-05-23  Subjective/Objective:                  Pt admitted with appendicitis. She is from home, ind. Mother at bedside. Pt is uninsured, FC aware. Pt has PCP and transportation to appointments. Pt's meds will be on $4 list. Pt with new DM dx. Pt given flyer with cost of diabetic supplies and instructed on what she will need to buy. Mother at bedside.   Action/Plan: Discharging home today. No further CM needs.   Expected Discharge Date:  08/26/16               Expected Discharge Plan:  Home/Self Care  In-House Referral:  NA  Discharge planning Services  CM Consult  Post Acute Care Choice:  NA Choice offered to:  NA  Status of Service:  Completed, signed off  Additional Comments:  late entry: pt unable to pay OOP for PCP's office - recently started new job and will have insurance in 90 days. [facility] to DC home today.   Malcolm Metrohildress, Jennavecia Schwier Demske, RN 08/26/2016, 10:35 AM

## 2016-08-26 NOTE — Addendum Note (Signed)
Addendum  created 08/26/16 1347 by Lauria Depoy J Janyiah Silveri, CRNA   Sign clinical note    

## 2016-08-26 NOTE — Progress Notes (Addendum)
Inpatient Diabetes Program Recommendations  AACE/ADA: New Consensus Statement on Inpatient Glycemic Control (2015)  Target Ranges:  Prepandial:   less than 140 mg/dL      Peak postprandial:   less than 180 mg/dL (1-2 hours)      Critically ill patients:  140 - 180 mg/dL  Results for Ann Richards, Ann Richards (MRN [id number]) as of 08/26/2016 07:14  Ref. Range 09/10/2010 22:41 08/24/2016 10:30 08/24/2016 23:28 08/26/2016 04:45  Glucose Latest Ref Range: 65 - 99 mg/dL 242 (H)  226 (H) 195 (H)  Hemoglobin A1C Latest Ref Range: 4.8 - 5.6 %  12.2 (H)      Review of Glycemic Control  Diabetes history: Pre-DM Outpatient Diabetes medications: NA Current orders for Inpatient glycemic control: Novolog 0-15 units TID with meals, Novolog 0-5 units QHS  Inpatient Diabetes Program Recommendations: HgbA1C: A1C 12.2% on 08/24/16 indicating an average glucose of 303 mg/dl over the past 2-3 months. Per ADA, if A1C is 6.5% or greater criteria is met to diagnose with DM.  Outpatient Referral: Recommend discharging patient on Metformin 1000 mg BID and Glipizide 2.5 mg BID (both are on $4 list at [facility]).   NOTE: Noted A1C 12.2% on 08/24/16 which meets ADA criteria for DM dx. IF MD is going to dx with DM, please inform patient and nursing staff so that patient can be educated by bedside nursing about DM. Patient will also need to have follow up arranged as well; CM has already been consulted. Will also need to keep cost of medication for DM in mind if prescribed DM medications at time of discharge.  Addendum 08/26/16'@11' :51- Spoke with patient (over phone) about new diabetes diagnosis.  Discussed A1C results (12.2% on 08/24/16) and explained what an A1C is and informed patient that her current A1C indicates an average glucose of 303 mg/dl over the past 2-3 months. Discussed basic pathophysiology of DM Type 2, basic home care, importance of checking CBGs and maintaining good CBG control to prevent long-term and short-term  complications. Reviewed glucose and A1C goals.  Reviewed signs and symptoms of hyperglycemia and hypoglycemia along with treatment for both. Discussed impact of nutrition, exercise, stress, sickness, and medications on diabetes control. Informed patient that Living Well with diabetes booklet has been ordered once and encouraged patient to read through entire book once received. Discussed Reli-On products (from [facility]) and encouraged patient to go to [facility] to get the Reli-On Prime glucometer for $9 and a box of 50 Reli-On test strips for $9.  Asked patient to check her glucose at least 2 times per day and to keep a log book of glucose readings. Explained how the doctor she follows up with can use the log book to continue to make adjustments with DM medications if needed. Consulted RD to for diet education. Discussed Carb Modified diet briefly.  Informed patient about free outpatient DM education class at [facility] [place] and encouraged patient to attend. Discussed Metformin and Glipizide and how they work to control DM. Informed patient that both are on the $4 list of medications at [facility].  Encouraged patient to follow up with local clinic ([facility]) since she does not have insurance and states she can not afford to see Dr. Wolfgang Phoenix until she can get insurance through work in a few months. Patient verbalized understanding of information discussed and she states that she has no further questions at this time related to diabetes.      Thanks, Barnie Alderman, RN, MSN, CDE Diabetes Coordinator Inpatient Diabetes Program  [phone] (Team Pager from [place] to [place])

## 2016-08-26 NOTE — Discharge Instructions (Signed)
Laparoscopic Appendectomy, Adult, Care After  These instructions give you information about caring for yourself after your procedure. Your doctor may also give you more specific instructions. Call your doctor if you have any problems or questions after your procedure.  Follow these instructions at home:  Medicines   · Take over-the-counter and prescription medicines only as told by your doctor.  · Do not drive for 24 hours if you received a sedative.  · Do not drive or use heavy machinery while taking prescription pain medicine.  · If you were prescribed an antibiotic medicine, take it as told by your doctor. Do not stop taking it even if you start to feel better.  Activity   · Do not lift anything that is heavier than 10 pounds (4.5 kg) for 3 weeks or as told by your doctor.  · Do not play contact sports for 3 weeks or as told by your doctor.  · Slowly return to your normal activities.  Bathing   · Keep your cuts from surgery (incisions) clean and dry.  ? Gently wash the cuts with soap and water.  ? Rinse the cuts with water until the soap is gone.  ? Pat the cuts dry with a clean towel. Do not rub the cuts.  · You may take showers after 48 hours.  · Do not take baths, swim, or use a hot tub for 2 weeks or as told by your doctor.  Cut Care   · Follow instructions from your doctor about how to take care of your cuts. Make sure you:  ? Wash your hands with soap and water before you change your bandage (dressing). If you do not have soap and water, use hand sanitizer.  ? Change your bandage as told by your doctor.  ? Leave stitches (sutures), skin glue, or skin tape (adhesive) strips in place. They may need to stay in place for 2 weeks or longer. If tape strips get loose and curl up, you may trim the loose edges. Do not remove tape strips completely unless your doctor says it is okay.  · Check your cuts every day for signs of infection. Check for:  ? More redness, swelling, or pain.  ? More fluid or  blood.  ? Warmth.  ? Pus or a bad smell.  Other Instructions   · If you were sent home with a drain, follow instructions from your doctor about how to use it and care for it.  · Take deep breaths. This helps to keep your lungs from getting swollen (inflamed).  · To help with constipation:  ? Drink plenty of fluids.  ? Eat plenty of fruits and vegetables.  · Keep all follow-up visits as told by your doctor. This is important.  Contact a doctor if:  · You have more redness, swelling, or pain around a cut from surgery.  · You have more fluid or blood coming from a cut.  · Your cut feels warm to the touch.  · You have pus or a bad smell coming from a cut or a bandage.  · The edges of a cut break open after the stitches have been taken out.  · You have pain in your shoulders that gets worse.  · You feel dizzy or you pass out (faint).  · You have shortness of breath.  · You keep feeling sick to your stomach (nauseous).  · You keep throwing up (vomiting).  · You get diarrhea or you cannot control your   poop.  · You lose your appetite.  · You have swelling or pain in your legs.  Get help right away if:  · You have a fever.  · You get a rash.  · You have trouble breathing.  · You have sharp pains in your chest.  This information is not intended to replace advice given to you by your health care provider. Make sure you discuss any questions you have with your health care provider.  Document Released: 04/02/2009 Document Revised: 11/12/2015 Document Reviewed: 11/24/2014  Elsevier Interactive Patient Education © 2017 Elsevier Inc.

## 2016-08-26 NOTE — Anesthesia Postprocedure Evaluation (Signed)
Anesthesia Post Note  Patient: Chauncy LeanDominique L Tetterton  Procedure(s) Performed: Procedure(s) (LRB): APPENDECTOMY LAPAROSCOPIC (N/A)  Patient location during evaluation: Nursing Unit Anesthesia Type: General Level of consciousness: awake, oriented and patient cooperative Pain management: pain level controlled Vital Signs Assessment: post-procedure vital signs reviewed and stable Respiratory status: spontaneous breathing, nonlabored ventilation and respiratory function stable Cardiovascular status: blood pressure returned to baseline Postop Assessment: no signs of nausea or vomiting Anesthetic complications: no     Last Vitals:  Vitals:   08/26/16 1009 08/26/16 1137  BP: (!) 155/104 (!) 156/94  Pulse: 95   Resp: 16   Temp: 36.8 C     Last Pain:  Vitals:   08/26/16 1009  TempSrc: Oral  PainSc:                  Bowie Delia J

## 2016-08-26 NOTE — Progress Notes (Signed)
Discharge instructions, prescriptions and work note given, verbalized understanding, out in stable condition via w/c with staff. 

## 2016-08-26 NOTE — Discharge Summary (Signed)
Physician Discharge Summary  Patient ID: Ann Richards MRN: [id number] DOB/AGE: Aug 17, 1990 25 y.o.  Admit date: 08/25/2016 Discharge date: 08/26/2016  Admission Diagnoses:Acute appendicitis, hyperglycemia  Discharge Diagnoses: Same, uncontrolled diabetes mellitus, urinary tract infection Active Problems:   Appendicitis   Acute appendicitis with localized peritonitis   Discharged Condition: good  Hospital Course: Patient is a 26 year old black female who presented the emergency room with lower abdominal pain. She was found on CT scan of the abdomen to have acute appendicitis. She was also found on workup to have a urinary tract infection. She was hyperglycemic when she presented to the hospital. She underwent a laparoscopic appendectomy on 08/25/2016. She tolerated the procedure well. Her postoperative course has been for the most part unremarkable. Her diet was advanced without difficulty. She states that she has been a diabetic in the past but was taken off her medication. Her A1c was 12. She will be started on metformin and glipizide. She was instructed to follow-up with her primary care physician. She will be treated for her urinary tract infection. No leukocytosis is resolving. She is being discharged home on 08/26/2016 in good and improving condition.  Consults: Diabetes coordinator   Treatments: surgery: Laparoscopic appendectomy on 08/25/2016  Discharge Exam: Blood pressure (!) 155/104, pulse 95, temperature 98.3 F (36.8 C), temperature source Oral, resp. rate 16, height 5\' 1"  (1.549 m), weight 163 lb (73.9 kg), last menstrual period 08/18/2016, SpO2 100 %. General appearance: alert, cooperative and no distress Resp: clear to auscultation bilaterally Cardio: regular rate and rhythm, S1, S2 normal, no murmur, click, rub or gallop GI: Soft, incisions healing well.  Disposition: 01-Home or Self Care  Discharge Instructions    Diet Carb Modified    Complete by:  As  directed    Increase activity slowly    Complete by:  As directed      Allergies as of 08/26/2016   No Known Allergies     Medication List    TAKE these medications   acidophilus Caps capsule Take 2 capsules by mouth daily.   ALPRAZolam 0.5 MG tablet Commonly known as:  XANAX TAKE ONE TABLET BY MOUTH ONCE DAILY AS NEEDED FOR ANXIETY/INSOMNIA. USE SPARINGLY   ciprofloxacin 500 MG tablet Commonly known as:  CIPRO Take 1 tablet (500 mg total) by mouth 2 (two) times daily.   escitalopram 10 MG tablet Commonly known as:  LEXAPRO Take 10 mg by mouth daily.   glipiZIDE 5 MG tablet Commonly known as:  GLUCOTROL Take 0.5 tablets (2.5 mg total) by mouth 2 (two) times daily before a meal.   HYDROcodone-acetaminophen 5-325 MG tablet Commonly known as:  NORCO Take 1-2 tablets by mouth every 4 (four) hours as needed for moderate pain.   ibuprofen 200 MG tablet Commonly known as:  ADVIL,MOTRIN Take 600-800 mg by mouth every 6 (six) hours as needed for moderate pain.   metFORMIN 1000 MG tablet Commonly known as:  GLUCOPHAGE Take 1 tablet (1,000 mg total) by mouth 2 (two) times daily with a meal.   multivitamin with minerals tablet Take 1 tablet by mouth daily. Reported on 08/04/2015   naproxen sodium 220 MG tablet Commonly known as:  ANAPROX Take 220-440 mg by mouth 2 (two) times daily as needed (pain/cramps).   VENTOLIN HFA 108 (90 Base) MCG/ACT inhaler Generic drug:  albuterol INHALE TWO PUFFS INTO THE LUNGS EVERY 4 HOURS AS NEEDED      Follow-up Information    Lubertha SouthSteve Luking, MD. Call in 1 week.  Specialty:  Family Medicine Why:  Call Dr. Gerda Diss to follow up with your diabetes Contact information: [address] [place] [address] [phone]        Franky Macho, MD. Schedule an appointment as soon as possible for a visit on 09/01/2016.   Specialty:  General Surgery Contact information: [address] [place] [address] [phone]            Signed: Franky Macho 08/26/2016, 10:19 AM

## 2016-09-01 ENCOUNTER — Encounter: Payer: Self-pay | Admitting: General Surgery

## 2016-09-01 ENCOUNTER — Ambulatory Visit (INDEPENDENT_AMBULATORY_CARE_PROVIDER_SITE_OTHER): Payer: Self-pay | Admitting: General Surgery

## 2016-09-01 VITALS — BP 165/98 | HR 97 | Temp 97.1°F | Resp 18 | Ht 61.0 in | Wt 160.0 lb

## 2016-09-01 DIAGNOSIS — Z09 Encounter for follow-up examination after completed treatment for conditions other than malignant neoplasm: Secondary | ICD-10-CM

## 2016-09-01 NOTE — Progress Notes (Signed)
Subjective:     Ann Richards    Status post laparoscopic appendectomy.  Doing well.  No complaints. Objective:    BP (!) 165/98   Pulse 97   Temp 97.1 F (36.2 C)   Resp 18   Ht 5\' 1"  (1.549 m)   Wt 160 lb (72.6 kg)   LMP 08/18/2016   BMI 30.23 kg/m   General:  alert, cooperative and no distress    Abdomen soft.  Incisions healing well.  Staples removed, Steri-Strips applied.     Assessment:    Doing well postoperatively.    Plan:    May return to normal activity.

## 2016-11-07 ENCOUNTER — Emergency Department (HOSPITAL_COMMUNITY)
Admission: EM | Admit: 2016-11-07 | Discharge: 2016-11-08 | Disposition: A | Discharge status: Home / Self Care | Payer: 59 | Attending: Emergency Medicine | Admitting: Emergency Medicine

## 2016-11-07 ENCOUNTER — Encounter (HOSPITAL_COMMUNITY): Payer: Self-pay | Admitting: *Deleted

## 2016-11-07 DIAGNOSIS — Z87891 Personal history of nicotine dependence: Secondary | ICD-10-CM | POA: Insufficient documentation

## 2016-11-07 DIAGNOSIS — Z7984 Long term (current) use of oral hypoglycemic drugs: Secondary | ICD-10-CM | POA: Insufficient documentation

## 2016-11-07 DIAGNOSIS — R197 Diarrhea, unspecified: Secondary | ICD-10-CM | POA: Insufficient documentation

## 2016-11-07 DIAGNOSIS — N3000 Acute cystitis without hematuria: Secondary | ICD-10-CM

## 2016-11-07 DIAGNOSIS — R112 Nausea with vomiting, unspecified: Secondary | ICD-10-CM

## 2016-11-07 LAB — COMPREHENSIVE METABOLIC PANEL
ALT: 18 U/L (ref 14–54)
ANION GAP: 10 (ref 5–15)
AST: 21 U/L (ref 15–41)
Albumin: 3.4 g/dL — ABNORMAL LOW (ref 3.5–5.0)
Alkaline Phosphatase: 69 U/L (ref 38–126)
BUN: 16 mg/dL (ref 6–20)
CHLORIDE: 103 mmol/L (ref 101–111)
CO2: 22 mmol/L (ref 22–32)
Calcium: 8.5 mg/dL — ABNORMAL LOW (ref 8.9–10.3)
Creatinine, Ser: 0.76 mg/dL (ref 0.44–1.00)
Glucose, Bld: 379 mg/dL — ABNORMAL HIGH (ref 65–99)
POTASSIUM: 3.3 mmol/L — AB (ref 3.5–5.1)
Sodium: 135 mmol/L (ref 135–145)
TOTAL PROTEIN: 6.6 g/dL (ref 6.5–8.1)
Total Bilirubin: 0.6 mg/dL (ref 0.3–1.2)

## 2016-11-07 LAB — CBC
HEMATOCRIT: 38.5 % (ref 36.0–46.0)
HEMOGLOBIN: 12.7 g/dL (ref 12.0–15.0)
MCH: 26.6 pg (ref 26.0–34.0)
MCHC: 33 g/dL (ref 30.0–36.0)
MCV: 80.5 fL (ref 78.0–100.0)
Platelets: 239 10*3/uL (ref 150–400)
RBC: 4.78 MIL/uL (ref 3.87–5.11)
RDW: 14.6 % (ref 11.5–15.5)
WBC: 10.8 10*3/uL — AB (ref 4.0–10.5)

## 2016-11-07 LAB — LIPASE, BLOOD: LIPASE: 19 U/L (ref 11–51)

## 2016-11-07 MED ORDER — ONDANSETRON 8 MG PO TBDP
8.0000 mg | ORAL_TABLET | Freq: Once | ORAL | Status: AC
  Administered 2016-11-07: 8 mg via ORAL
  Filled 2016-11-07: qty 1

## 2016-11-07 MED ORDER — SODIUM CHLORIDE 0.9 % IV BOLUS (SEPSIS): 1000.0000 mL | Freq: Once | INTRAVENOUS | Status: DC

## 2016-11-07 MED ORDER — ONDANSETRON HCL 4 MG/2ML IJ SOLN
4.0000 mg | Freq: Once | INTRAMUSCULAR | Status: DC
  Filled 2016-11-07: qty 2

## 2016-11-07 NOTE — ED Notes (Signed)
Patient requesting note for work for today.

## 2016-11-07 NOTE — ED Provider Notes (Signed)
[facility]-EMERG[facility]Y DEPT Provider Note   CSN: [id number] Arrival date & time: 11/07/16  2217 By signing my name below, I, Ann Richards, attest that this documentation has been prepared under the direction and in the presence of Ann Bilis, MD . Electronically Signed: Levon Richards, Scribe. 11/07/2016. 11:53 PM.   History   Chief Complaint Chief Complaint  Patient presents with  . Emesis   HPI Ann Richards is a 26 y.o. female with a history of DM who presents to the Emergency Department complaining of frequent vomiting onset this morning at 3 am. She reports associated nausea, diarrhea, and mild abdominal pain. No alleviating or modifying factors noted.  No OTC treatments tried for these symptoms PTA. She is currently on glipizide and Metformin to manage her DM. No recent sick contacts. Pt is currently employed at a call center and as a CNA; per pt, none of her patients have similar symptoms. She denies any hematemesis or hematochezia, and has no other acute complaints at this time.  The history is provided by the patient. No language interpreter was used.    History reviewed. No pertinent past medical history.  Patient Active Problem List   Diagnosis Date Noted  . Appendicitis 08/25/2016  . Acute appendicitis with localized peritonitis   . Generalized anxiety disorder 11/16/2015  . Depression 11/16/2015    Past Surgical History:  Procedure Laterality Date  . LAPAROSCOPIC APPENDECTOMY N/A 08/25/2016   Procedure: APPENDECTOMY LAPAROSCOPIC;  Surgeon: Franky Macho, MD;  Location: [facility] ORS;  Service: General;  Laterality: N/A;    OB History    No data available       Home Medications    Prior to Admission medications   Medication Sig Start Date End Date Taking? Authorizing Provider  ALPRAZolam Prudy Feeler) 0.5 MG tablet TAKE ONE TABLET BY MOUTH ONCE DAILY AS NEEDED FOR ANXIETY/INSOMNIA. USE SPARINGLY 12/15/15  Yes [name], MD  bismuth subsalicylate (PEPTO BISMOL)  262 MG/15ML suspension Take 30 mLs by mouth every 6 (six) hours as needed for indigestion or diarrhea or loose stools.   Yes [provider]  escitalopram (LEXAPRO) 10 MG tablet Take 10 mg by mouth daily.   Yes [provider]  glipiZIDE (GLUCOTROL) 5 MG tablet Take 0.5 tablets (2.5 mg total) by mouth 2 (two) times daily before a meal. 08/26/16  Yes Franky Macho, MD  guaiFENesin (MUCINEX) 600 MG 12 hr tablet Take 600 mg by mouth daily as needed for cough or to loosen phlegm.   Yes [provider]  ibuprofen (ADVIL,MOTRIN) 200 MG tablet Take 600-800 mg by mouth every 6 (six) hours as needed for moderate pain.   Yes [provider]  metFORMIN (GLUCOPHAGE) 1000 MG tablet Take 1 tablet (1,000 mg total) by mouth 2 (two) times daily with a meal. 08/26/16  Yes Franky Macho, MD  Multiple Vitamins-Minerals (MULTIVITAMIN WITH MINERALS) tablet Take 1 tablet by mouth daily. Reported on 08/04/2015   Yes [provider]  naproxen sodium (ANAPROX) 220 MG tablet Take 220-440 mg by mouth 2 (two) times daily as needed (pain/cramps).   Yes [provider]  VENTOLIN HFA 108 (90 Base) MCG/ACT inhaler INHALE TWO PUFFS INTO THE LUNGS EVERY 4 HOURS AS NEEDED 03/08/16  Yes [name], MD  ciprofloxacin (CIPRO) 500 MG tablet Take 1 tablet (500 mg total) by mouth 2 (two) times daily. Patient not taking: Reported on 11/07/2016 08/26/16   Franky Macho, MD  HYDROcodone-acetaminophen [facility]) 5-325 MG tablet Take 1-2 tablets by mouth  every 4 (four) hours as needed for moderate pain. Patient not taking: Reported on 11/07/2016 08/26/16   Franky MachoJenkins, Mark, MD    Family History No family history on file.  Social History Social History  Substance Use Topics  . Smoking status: Former Smoker    Packs/day: 0.25    Years: 1.00    Types: Cigarettes    Start date: 03/14/2008    Quit date: 05/14/2009  . Smokeless tobacco: Never Used  . Alcohol use 0.0 oz/week     Comment: ocassional      Allergies   Patient has no known allergies.   Review of Systems Review of Systems All systems reviewed and are negative for acute change except as noted in the HPI.  Physical Exam Updated Vital Signs BP 121/73 (BP Location: Right Arm)   Pulse (!) 102   Temp 98.9 F (37.2 C) (Oral)   Resp 17   Ht 5\' 1"  (1.549 m)   Wt 150 lb (68 kg)   LMP 10/17/2016   SpO2 100%   BMI 28.34 kg/m   Physical Exam  Constitutional: She is oriented to person, place, and time. She appears well-developed and well-nourished. No distress.  HENT:  Head: Normocephalic and atraumatic.  Eyes: EOM are normal.  Neck: Normal range of motion.  Cardiovascular: Normal rate, regular rhythm and normal heart sounds.   Pulmonary/Chest: Effort normal and breath sounds normal.  Abdominal: Soft. She exhibits no distension. There is no tenderness.  Musculoskeletal: Normal range of motion.  Neurological: She is alert and oriented to person, place, and time.  Skin: Skin is warm and dry.  Psychiatric: She has a normal mood and affect. Judgment normal.  Nursing note and vitals reviewed.  ED Treatments / Results  DIAGNOSTIC STUDIES:  Oxygen Saturation is 100% on RA, normal by my interpretation.    COORDINATION OF CARE:  11:32 PM Discussed treatment plan with pt at bedside and pt agreed to plan.   Labs (all labs ordered are listed, but only abnormal results are displayed) Labs Reviewed  COMPREHENSIVE METABOLIC PANEL - Abnormal; Notable for the following:       Result Value   Potassium 3.3 (*)    Glucose, Bld 379 (*)    Calcium 8.5 (*)    Albumin 3.4 (*)    All other components within normal limits  CBC - Abnormal; Notable for the following:    WBC 10.8 (*)    All other components within normal limits  URINALYSIS, ROUTINE W REFLEX MICROSCOPIC - Abnormal; Notable for the following:    APPearance HAZY (*)    Glucose, UA >=500 (*)    Ketones, ur 5 (*)    Leukocytes, UA MODERATE (*)    Bacteria, UA RARE  (*)    Squamous Epithelial / LPF 6-30 (*)    All other components within normal limits  URINE CULTURE  LIPASE, BLOOD  PREGNANCY, URINE    EKG  EKG Interpretation None       Radiology No results found.  Procedures Procedures (including critical care time)  Medications Ordered in ED Medications - No data to display   Initial Impression / Assessment and Plan / ED Course  I have reviewed the triage vital signs and the nursing notes.  Pertinent labs & imaging results that were available during my care of the patient were reviewed by me and considered in my medical decision making (see chart for details).     Patient is overall well-appearing.  Repeat abdominal exam without  tenderness.  Patient tolerating fluids.  Discharge home in good condition.  Home with nausea medication.  Questionable UTI.  Urine culture sent.  Home with 5 days of Keflex.  Patient understands return to the ER for new or worsening symptoms  Final Clinical Impressions(s) / ED Diagnoses   Final diagnoses:  Nausea vomiting and diarrhea  Acute cystitis without hematuria    New Prescriptions New Prescriptions   CEPHALEXIN (KEFLEX) 500 MG CAPSULE    Take 1 capsule (500 mg total) by mouth 3 (three) times daily.   ONDANSETRON (ZOFRAN ODT) 8 MG DISINTEGRATING TABLET    Take 1 tablet (8 mg total) by mouth every 8 (eight) hours as needed for nausea or vomiting.   I personally performed the services described in this documentation, which was scribed in my presence. The recorded information has been reviewed and is accurate.        Ann Bilis, MD 11/08/16 [phone]

## 2016-11-07 NOTE — ED Triage Notes (Signed)
C/o vomiting and diarrhea since 0300 today.

## 2016-11-08 LAB — URINALYSIS, ROUTINE W REFLEX MICROSCOPIC
BILIRUBIN URINE: NEGATIVE
Glucose, UA: >=500 mg/dL — AB
HGB URINE DIPSTICK: NEGATIVE
Ketones, ur: 5 mg/dL — AB
NITRITE: NEGATIVE
PH: 5 (ref 5.0–8.0)
Protein, ur: NEGATIVE mg/dL
SPECIFIC GRAVITY, URINE: 1.029 (ref 1.005–1.030)

## 2016-11-08 LAB — PREGNANCY, URINE: Preg Test, Ur: NEGATIVE

## 2016-11-08 MED ORDER — ONDANSETRON 8 MG PO TBDP: 8.0000 mg | ORAL_TABLET | Freq: Three times a day (TID) | ORAL | 0 refills | Status: DC | PRN

## 2016-11-08 MED ORDER — CEPHALEXIN 500 MG PO CAPS
500.0000 mg | ORAL_CAPSULE | Freq: Once | ORAL | Status: AC
  Administered 2016-11-08: 500 mg via ORAL
  Filled 2016-11-08: qty 1

## 2016-11-08 MED ORDER — CEPHALEXIN 500 MG PO CAPS: 500.0000 mg | ORAL_CAPSULE | Freq: Three times a day (TID) | ORAL | 0 refills | Status: DC

## 2016-11-08 NOTE — ED Notes (Signed)
Asked pt for urine sample, pt said "In a little while"

## 2016-11-08 NOTE — ED Notes (Signed)
Pt ambulatory to waiting room. Pt verbalized understanding of discharge instructions.   

## 2016-11-09 LAB — URINE CULTURE

## 2016-11-15 ENCOUNTER — Emergency Department (HOSPITAL_COMMUNITY)
Admission: EM | Admit: 2016-11-15 | Discharge: 2016-11-16 | Disposition: A | Discharge status: Home / Self Care | Payer: 59 | Attending: Emergency Medicine | Admitting: Emergency Medicine

## 2016-11-15 ENCOUNTER — Encounter (HOSPITAL_COMMUNITY): Payer: Self-pay | Admitting: *Deleted

## 2016-11-15 DIAGNOSIS — R197 Diarrhea, unspecified: Secondary | ICD-10-CM | POA: Insufficient documentation

## 2016-11-15 DIAGNOSIS — R739 Hyperglycemia, unspecified: Secondary | ICD-10-CM

## 2016-11-15 DIAGNOSIS — B379 Candidiasis, unspecified: Secondary | ICD-10-CM

## 2016-11-15 DIAGNOSIS — E1165 Type 2 diabetes mellitus with hyperglycemia: Secondary | ICD-10-CM | POA: Insufficient documentation

## 2016-11-15 DIAGNOSIS — Z79899 Other long term (current) drug therapy: Secondary | ICD-10-CM | POA: Insufficient documentation

## 2016-11-15 DIAGNOSIS — R1110 Vomiting, unspecified: Secondary | ICD-10-CM

## 2016-11-15 DIAGNOSIS — Z7984 Long term (current) use of oral hypoglycemic drugs: Secondary | ICD-10-CM | POA: Insufficient documentation

## 2016-11-15 HISTORY — DX: Insomnia, unspecified: G47.00

## 2016-11-15 HISTORY — DX: Anxiety disorder, unspecified: F41.9

## 2016-11-15 HISTORY — DX: Major depressive disorder, single episode, unspecified: F32.9

## 2016-11-15 HISTORY — DX: Type 2 diabetes mellitus without complications: E11.9

## 2016-11-15 HISTORY — DX: Depression, unspecified: F32.A

## 2016-11-15 LAB — URINALYSIS, ROUTINE W REFLEX MICROSCOPIC
BACTERIA UA: NONE SEEN
BILIRUBIN URINE: NEGATIVE
Glucose, UA: >=500 mg/dL — AB
Ketones, ur: 5 mg/dL — AB
Leukocytes, UA: NEGATIVE
NITRITE: NEGATIVE
PROTEIN: NEGATIVE mg/dL
SPECIFIC GRAVITY, URINE: 1.026 (ref 1.005–1.030)
Squamous Epithelial / LPF: NONE SEEN
pH: 6 (ref 5.0–8.0)

## 2016-11-15 LAB — CBC WITH DIFFERENTIAL/PLATELET
BASOS ABS: 0 10*3/uL (ref 0.0–0.1)
BASOS PCT: 0 %
EOS ABS: 0.3 10*3/uL (ref 0.0–0.7)
Eosinophils Relative: 4 %
HCT: 41.5 % (ref 36.0–46.0)
Hemoglobin: 13.8 g/dL (ref 12.0–15.0)
LYMPHS PCT: 50 %
Lymphs Abs: 4.5 10*3/uL — ABNORMAL HIGH (ref 0.7–4.0)
MCH: 26.7 pg (ref 26.0–34.0)
MCHC: 33.3 g/dL (ref 30.0–36.0)
MCV: 80.3 fL (ref 78.0–100.0)
Monocytes Absolute: 0.5 10*3/uL (ref 0.1–1.0)
Monocytes Relative: 6 %
Neutro Abs: 3.6 10*3/uL (ref 1.7–7.7)
Neutrophils Relative %: 40 %
PLATELETS: 272 10*3/uL (ref 150–400)
RBC: 5.17 MIL/uL — AB (ref 3.87–5.11)
RDW: 14.4 % (ref 11.5–15.5)
WBC: 8.9 10*3/uL (ref 4.0–10.5)

## 2016-11-15 LAB — BASIC METABOLIC PANEL
ANION GAP: 9 (ref 5–15)
BUN: 15 mg/dL (ref 6–20)
CALCIUM: 9.2 mg/dL (ref 8.9–10.3)
CO2: 23 mmol/L (ref 22–32)
Chloride: 105 mmol/L (ref 101–111)
Creatinine, Ser: 0.82 mg/dL (ref 0.44–1.00)
Glucose, Bld: 355 mg/dL — ABNORMAL HIGH (ref 65–99)
POTASSIUM: 4.2 mmol/L (ref 3.5–5.1)
SODIUM: 137 mmol/L (ref 135–145)

## 2016-11-15 LAB — PREGNANCY, URINE: PREG TEST UR: NEGATIVE

## 2016-11-15 MED ORDER — IBUPROFEN 800 MG PO TABS
800.0000 mg | ORAL_TABLET | Freq: Once | ORAL | Status: AC
  Administered 2016-11-16: 800 mg via ORAL
  Filled 2016-11-15: qty 1

## 2016-11-15 NOTE — ED Notes (Signed)
Pt now stating that she feels like she is getting a yeast infection.

## 2016-11-15 NOTE — ED Provider Notes (Signed)
[facility] Provider Note   CSN: [id number] Arrival date & time: 11/15/16  2115  By signing my name below, I, Diona Browner, attest that this documentation has been prepared under the direction and in the presence of Zadie Rhine, MD. Electronically Signed: Diona Browner, ED Scribe. 11/15/16. 11:25 PM.  History   Chief Complaint Chief Complaint  Patient presents with  . Emesis    HPI Ann Richards is a 26 y.o. female with a PMHx of DM who presents to the Emergency Department complaining of sudden emesis that started ~ 4:30 am on 11/15/16. Associated sx include nausea, diarrhea, and body aches. She notes her abdomen is sore, but not in any pain. She also thinks she might have a yeast infection. Others at her job have been sick. No recent travel. She isn't able to manage her DM as well as she would like because of her job. Pt denies fever, dysuria, cough, sore throat, and blood in stool.  The history is provided by the patient. No language interpreter was used.  Emesis   This is a new problem. The current episode started 12 to 24 hours ago. The problem occurs 2 to 4 times per day. The problem has not changed since onset.There has been no fever. Associated symptoms include arthralgias, diarrhea and myalgias. Pertinent negatives include no abdominal pain, no cough and no fever.    Past Medical History:  Diagnosis Date  . Anxiety   . Depression   . Diabetes mellitus without complication (HCC)   . Insomnia     Patient Active Problem List   Diagnosis Date Noted  . Appendicitis 08/25/2016  . Acute appendicitis with localized peritonitis   . Generalized anxiety disorder 11/16/2015  . Depression 11/16/2015    Past Surgical History:  Procedure Laterality Date  . LAPAROSCOPIC APPENDECTOMY N/A 08/25/2016   Procedure: APPENDECTOMY LAPAROSCOPIC;  Surgeon: Franky Macho, MD;  Location: [facility];  Service: General;  Laterality: N/A;    OB History    No data available         Home Medications    Prior to Admission medications   Medication Sig Start Date End Date Taking? Authorizing Provider  ALPRAZolam Prudy Feeler) 0.5 MG tablet TAKE ONE TABLET BY MOUTH ONCE DAILY AS NEEDED FOR ANXIETY/INSOMNIA. USE SPARINGLY 12/15/15  Yes [name], MD  bismuth subsalicylate (PEPTO BISMOL) 262 MG/15ML suspension Take 30 mLs by mouth every 6 (six) hours as needed for indigestion or diarrhea or loose stools.   Yes [provider]  cephALEXin (KEFLEX) 500 MG capsule Take 1 capsule (500 mg total) by mouth 3 (three) times daily. 11/08/16  Yes Azalia Bilis, MD  escitalopram (LEXAPRO) 10 MG tablet Take 10 mg by mouth daily.   Yes [provider]  glipiZIDE (GLUCOTROL) 5 MG tablet Take 0.5 tablets (2.5 mg total) by mouth 2 (two) times daily before a meal. 08/26/16  Yes Franky Macho, MD  guaiFENesin (MUCINEX) 600 MG 12 hr tablet Take 600 mg by mouth daily as needed for cough or to loosen phlegm.   Yes [provider]  ibuprofen (ADVIL,MOTRIN) 200 MG tablet Take 600-800 mg by mouth every 6 (six) hours as needed for moderate pain.   Yes [provider]  loperamide (IMODIUM A-D) 2 MG tablet Take 2 mg by mouth 4 (four) times daily as needed for diarrhea or loose stools.   Yes [provider]  metFORMIN (GLUCOPHAGE) 1000 MG tablet Take 1 tablet (1,000 mg total) by mouth 2 (two) times  daily with a meal. 08/26/16  Yes Franky MachoJenkins, Mark, MD  Multiple Vitamins-Minerals (MULTIVITAMIN WITH MINERALS) tablet Take 1 tablet by mouth daily. Reported on 08/04/2015   Yes [provider]  naproxen sodium (ANAPROX) 220 MG tablet Take 220-440 mg by mouth 2 (two) times daily as needed (pain/cramps).   Yes [provider]  Probiotic Product (PROBIOTIC FORMULA PO) Take 1 capsule by mouth daily.   Yes [provider]  VENTOLIN HFA 108 (90 Base) MCG/ACT inhaler INHALE TWO PUFFS INTO THE LUNGS EVERY 4 HOURS AS NEEDED 03/08/16  Yes Merlyn AlbertLuking, William  S, MD  ondansetron (ZOFRAN ODT) 8 MG disintegrating tablet Take 1 tablet (8 mg total) by mouth every 8 (eight) hours as needed for nausea or vomiting. Patient not taking: Reported on 11/15/2016 11/08/16   Azalia Bilisampos, Kevin, MD    Family History History reviewed. No pertinent family history.  Social History Social History  Substance Use Topics  . Smoking status: Former Smoker    Packs/day: 0.25    Years: 1.00    Types: Cigarettes    Start date: 03/14/2008    Quit date: 05/14/2009  . Smokeless tobacco: Never Used  . Alcohol use 0.0 oz/week     Comment: ocassional     Allergies   Patient has no known allergies.   Review of Systems Review of Systems  Constitutional: Negative for fever.  HENT: Negative for sore throat.   Respiratory: Negative for cough.   Gastrointestinal: Positive for diarrhea, nausea and vomiting. Negative for abdominal pain and blood in stool.  Genitourinary: Negative for dysuria.  Musculoskeletal: Positive for arthralgias and myalgias.  All other systems reviewed and are negative.    Physical Exam Updated Vital Signs BP (!) 150/97 (BP Location: Right Arm)   Pulse 94   Temp 98.1 F (36.7 C) (Oral)   Resp 20   Ht 5\' 1"  (1.549 m)   Wt 150 lb (68 kg)   LMP 10/16/2016   SpO2 99%   BMI 28.34 kg/m   Physical Exam  CONSTITUTIONAL: Well developed/well nourished HEAD: Normocephalic/atraumatic EYES: EOMI/PERRL, no icterus  ENMT: Mucous membranes moist NECK: supple no meningeal signs SPINE/BACK:entire spine nontender CV: S1/S2 noted, no murmurs/rubs/gallops noted LUNGS: Lungs are clear to auscultation bilaterally, no apparent distress ABDOMEN: soft, nontender, no rebound or guarding, bowel sounds noted throughout abdomen GU:no cva tenderness NEURO: Pt is awake/alert/appropriate, moves all extremitiesx4.  No facial droop.   EXTREMITIES: pulses normal/equal, full ROM SKIN: warm, color normal PSYCH: no abnormalities of mood noted, alert and oriented to  situation  ED Treatments / Results  DIAGNOSTIC STUDIES: Oxygen Saturation is 99% on RA, normal by my interpretation.   COORDINATION OF CARE: 11:25 PM-Discussed next steps with pt. Pt verbalized understanding and is agreeable with the plan.    Labs (all labs ordered are listed, but only abnormal results are displayed) Labs Reviewed  URINALYSIS, ROUTINE W REFLEX MICROSCOPIC - Abnormal; Notable for the following:       Result Value   Color, Urine COLORLESS (*)    Glucose, UA >=500 (*)    Hgb urine dipstick SMALL (*)    Ketones, ur 5 (*)    All other components within normal limits  CBC WITH DIFFERENTIAL/PLATELET - Abnormal; Notable for the following:    RBC 5.17 (*)    Lymphs Abs 4.5 (*)    All other components within normal limits  BASIC METABOLIC PANEL - Abnormal; Notable for the following:    Glucose, Bld 355 (*)  All other components within normal limits  PREGNANCY, URINE    EKG  EKG Interpretation None       Radiology No results found.  Procedures Procedures (including critical care time)  Medications Ordered in ED Medications  ibuprofen (ADVIL,MOTRIN) tablet 800 mg (800 mg Oral Given 11/16/16 0010)  fluconazole (DIFLUCAN) tablet 100 mg (100 mg Oral Given 11/16/16 0142)     Initial Impression / Assessment and Plan / ED Course  I have reviewed the triage vital signs and the nursing notes.  Pertinent labs  results that were available during my care of the patient were reviewed by me and considered in my medical decision making (see chart for details).     Pt well appearing She is taking PO No vomiting abd soft/nontender No UTI No signs of DKA Will d/c home She requests meds for yeast infection  Final Clinical Impressions(s) / ED Diagnoses   Final diagnoses:  Vomiting and diarrhea  Hyperglycemia  Yeast infection    New Prescriptions New Prescriptions   No medications on file   I personally performed the services described in this  documentation, which was scribed in my presence. The recorded information has been reviewed and is accurate.       Zadie Rhine, MD 11/16/16 [phone]

## 2016-11-15 NOTE — ED Triage Notes (Addendum)
Pt reports n/v/d since around 4:30 am. Pt was seen here for similar symptoms last week and was told she had a uti and was given Cipro. Pt does reports urinary frequency.

## 2016-11-16 MED ORDER — FLUCONAZOLE 100 MG PO TABS
100.0000 mg | ORAL_TABLET | Freq: Once | ORAL | Status: AC
  Administered 2016-11-16: 100 mg via ORAL
  Filled 2016-11-16: qty 1

## 2016-11-23 ENCOUNTER — Encounter: Payer: Self-pay | Admitting: Family Medicine

## 2016-11-23 ENCOUNTER — Ambulatory Visit (INDEPENDENT_AMBULATORY_CARE_PROVIDER_SITE_OTHER): Payer: Self-pay | Admitting: Family Medicine

## 2016-11-23 VITALS — BP 134/84 | Ht 61.0 in | Wt 158.5 lb

## 2016-11-23 DIAGNOSIS — E119 Type 2 diabetes mellitus without complications: Secondary | ICD-10-CM

## 2016-11-23 LAB — POCT GLYCOSYLATED HEMOGLOBIN (HGB A1C): HEMOGLOBIN A1C: 10.3

## 2016-11-23 MED ORDER — GLIPIZIDE 5 MG PO TABS: 5.0000 mg | ORAL_TABLET | Freq: Two times a day (BID) | ORAL | 5 refills | Status: DC

## 2016-11-23 NOTE — Progress Notes (Signed)
   Subjective:    Patient ID: Ann Richards, female    DOB: 24-Apr-1991, 26 y.o.   MRN: [id number]  Diabetes  She presents for her initial diabetic visit. She has type 1 diabetes mellitus. Associated symptoms include fatigue, polydipsia, visual change and weakness. Her breakfast blood glucose range is generally >200 mg/dl. Her overall blood glucose range is >200 mg/dl.    Patient diaglnosed with Diabetes in March at Hospital. States has not seen a specialist or anyone for diabetes. Has had sugars running in the 400's  Results for orders placed or performed in visit on 11/23/16  POCT HgB A1C  Result Value Ref Range   Hemoglobin A1C 10.3    Two uncles with diab  forst ;earned had it this spring when in for apendectomy  No insur currently  ' Review of Systems  Constitutional: Positive for fatigue.  Endocrine: Positive for polydipsia.  Neurological: Positive for weakness.       Objective:   Physical Exam Alert vitals stable, NAD. Blood pressure good on repeat. HEENT normal. Lungs clear. Heart regular rate and rhythm.        Assessment & Plan:  Impression type 2 diabetes. Patient means well but she clearly is confused on numerous issues. For instance she thought the glucose tablets she by the grocery store [employer] to lower your sugar, these are meant to raise his sugar in the event of hypoglycemia. Patient also rates loose stools on the metformin. She went ahead and stopped metformin plus she stopped the glipizide because "they're supposed to be taken at the same time" she stated she had "type 1 diabetes" all of her chart work states type II reinitiate glipizide 5 mg twice a day. Hypoglycemia discussed, strongly encouraged to go to diabetes education class. Follow-up in 2 months. Call us in a couple weeks with morning sugar numbers

## 2016-11-23 NOTE — Patient Instructions (Signed)
You have type 2 diabetes  w after our discussion today, I think it is very important for you to learn more about your diabete  The hospital offers a free educational program that you really owe it to yourself to go to, to learn more  We will restart  The glipizide but at a bit higher dose  The sugar tablets were not meant to take when you feel bad, but only to take if your blood sugar is very low after a too strong response to medicine, or after skipping a meal and getting a very low sugar reading, which means you need to take a sugar tab to increase the sugar  Call us in about two weeks and let us know how the morn sugars are doing

## 2016-12-19 ENCOUNTER — Encounter (HOSPITAL_COMMUNITY): Payer: Self-pay

## 2016-12-19 ENCOUNTER — Emergency Department (HOSPITAL_COMMUNITY)
Admission: EM | Admit: 2016-12-19 | Discharge: 2016-12-20 | Disposition: A | Discharge status: Home / Self Care | Payer: PRIVATE HEALTH INSURANCE | Attending: Emergency Medicine | Admitting: Emergency Medicine

## 2016-12-19 DIAGNOSIS — Z87891 Personal history of nicotine dependence: Secondary | ICD-10-CM | POA: Insufficient documentation

## 2016-12-19 DIAGNOSIS — Z79899 Other long term (current) drug therapy: Secondary | ICD-10-CM | POA: Diagnosis not present

## 2016-12-19 DIAGNOSIS — R21 Rash and other nonspecific skin eruption: Secondary | ICD-10-CM | POA: Insufficient documentation

## 2016-12-19 DIAGNOSIS — Z7984 Long term (current) use of oral hypoglycemic drugs: Secondary | ICD-10-CM | POA: Insufficient documentation

## 2016-12-19 DIAGNOSIS — E119 Type 2 diabetes mellitus without complications: Secondary | ICD-10-CM | POA: Insufficient documentation

## 2016-12-19 MED ORDER — TRIAMCINOLONE ACETONIDE 0.1 % EX CREA: 1.0000 "application " | TOPICAL_CREAM | Freq: Three times a day (TID) | CUTANEOUS | 0 refills | Status: DC

## 2016-12-19 NOTE — ED Triage Notes (Signed)
Reports of "sandpaper" like rash since this morning. Has not taken anything  Otc. Needs work note.

## 2016-12-19 NOTE — Discharge Instructions (Signed)
Stop using the Zest soap.  You can take benadryl one capsule every 6 hrs if needed for itching.  Follow-up with your doctor for recheck if needed.

## 2016-12-19 NOTE — ED Provider Notes (Signed)
[facility] Provider Note   CSN: [id number] Arrival date & time: 12/19/16  2150     History   Chief Complaint Chief Complaint  Patient presents with  . Rash    HPI Ann Richards is a 26 y.o. female.  HPI   Ann Richards is a 26 y.o. female who presents to the Emergency Department complaining of generalized, itching rash to her face, arms, neck and upper back.  She states that she began using Zest soap a few days ago prior to onset.  Noticed a fine, rough rash this morning that has spread.  She denies pain, hives, facial swelling, difficulty swallowing or shortness of breath.  Tried hot showers today without relief.     Past Medical History:  Diagnosis Date  . Anxiety   . Depression   . Diabetes mellitus without complication (HCC)   . Insomnia     Patient Active Problem List   Diagnosis Date Noted  . Appendicitis 08/25/2016  . Acute appendicitis with localized peritonitis   . Generalized anxiety disorder 11/16/2015  . Depression 11/16/2015    Past Surgical History:  Procedure Laterality Date  . LAPAROSCOPIC APPENDECTOMY N/A 08/25/2016   Procedure: APPENDECTOMY LAPAROSCOPIC;  Surgeon: Franky Macho, MD;  Location: [facility];  Service: General;  Laterality: N/A;    OB History    No data available       Home Medications    Prior to Admission medications   Medication Sig Start Date End Date Taking? Authorizing Provider  ALPRAZolam Prudy Feeler) 0.5 MG tablet TAKE ONE TABLET BY MOUTH ONCE DAILY AS NEEDED FOR ANXIETY/INSOMNIA. USE SPARINGLY 12/15/15  Yes [name], MD  bismuth subsalicylate (PEPTO BISMOL) 262 MG/15ML suspension Take 30 mLs by mouth every 6 (six) hours as needed for indigestion or diarrhea or loose stools.   Yes [provider]  escitalopram (LEXAPRO) 10 MG tablet Take 10 mg by mouth daily.   Yes [provider]  glipiZIDE (GLUCOTROL) 5 MG tablet Take 1 tablet (5 mg total) by mouth 2 (two) times daily before  a meal. 11/23/16  Yes [name], MD  ibuprofen (ADVIL,MOTRIN) 200 MG tablet Take 600-800 mg by mouth every 6 (six) hours as needed for moderate pain.   Yes [provider]  Probiotic Product (PROBIOTIC FORMULA PO) Take 1 capsule by mouth daily.   Yes [provider]  VENTOLIN HFA 108 (90 Base) MCG/ACT inhaler INHALE TWO PUFFS INTO THE LUNGS EVERY 4 HOURS AS NEEDED 03/08/16  Yes [name], MD  cephALEXin (KEFLEX) 500 MG capsule Take 1 capsule (500 mg total) by mouth 3 (three) times daily. Patient not taking: Reported on 12/19/2016 11/08/16   Azalia Bilis, MD  metFORMIN (GLUCOPHAGE) 1000 MG tablet Take 1 tablet (1,000 mg total) by mouth 2 (two) times daily with a meal. Patient not taking: Reported on 12/19/2016 08/26/16   Franky Macho, MD  ondansetron (ZOFRAN ODT) 8 MG disintegrating tablet Take 1 tablet (8 mg total) by mouth every 8 (eight) hours as needed for nausea or vomiting. Patient not taking: Reported on 11/15/2016 11/08/16   Azalia Bilis, MD    Family History No family history on file.  Social History Social History  Substance Use Topics  . Smoking status: Former Smoker    Packs/day: 0.25    Years: 1.00    Types: Cigarettes    Start date: 03/14/2008    Quit date: 05/14/2009  . Smokeless tobacco: Never Used  . Alcohol use 0.0 oz/week  Comment: ocassional     Allergies   Patient has no known allergies.   Review of Systems Review of Systems  Constitutional: Negative for activity change, appetite change, chills and fever.  HENT: Negative for facial swelling, sore throat and trouble swallowing.   Respiratory: Negative for chest tightness, shortness of breath and wheezing.   Musculoskeletal: Negative for neck pain and neck stiffness.  Skin: Positive for rash. Negative for color change and wound.  Neurological: Negative for dizziness, weakness, numbness and headaches.  All other systems reviewed and are negative.    Physical Exam Updated Vital  Signs BP 123/81   Pulse 79   Temp 97.9 F (36.6 C) (Oral)   Resp 16   Ht 5\' 5"  (1.651 m)   Wt 69.9 kg (154 lb)   LMP 12/08/2016   SpO2 98%   BMI 25.63 kg/m   Physical Exam  Constitutional: She is oriented to person, place, and time. She appears well-developed and well-nourished. No distress.  HENT:  Head: Normocephalic and atraumatic.  Mouth/Throat: Uvula is midline, oropharynx is clear and moist and mucous membranes are normal. No oral lesions. No uvula swelling.  Neck: Normal range of motion. Neck supple.  Cardiovascular: Normal rate, regular rhythm, normal heart sounds and intact distal pulses.   No murmur heard. Pulmonary/Chest: Effort normal and breath sounds normal. No respiratory distress.  Musculoskeletal: Normal range of motion. She exhibits no edema or tenderness.  Lymphadenopathy:    She has no cervical adenopathy.  Neurological: She is alert and oriented to person, place, and time. She exhibits normal muscle tone. Coordination normal.  Skin: Skin is warm. Capillary refill takes less than 2 seconds. Rash noted. There is erythema.  Fine, sandpaper rash to the bilateral upper extremities, neck and upper back.  No induration, hives or vesicles.   Psychiatric: She has a normal mood and affect.  Nursing note and vitals reviewed.    ED Treatments / Results  Labs (all labs ordered are listed, but only abnormal results are displayed) Labs Reviewed - No data to display  EKG  EKG Interpretation None       Radiology No results found.  Procedures Procedures (including critical care time)  Medications Ordered in ED Medications - No data to display   Initial Impression / Assessment and Plan / ED Course  I have reviewed the triage vital signs and the nursing notes.  Pertinent labs & imaging results that were available during my care of the patient were reviewed by me and considered in my medical decision making (see chart for details).     Pt is well  appearing.  vitals stable.  Discreet rash after using a new soap.  No sore throat, recent fever or illness.  Appears stable for d/c.    Final Clinical Impressions(s) / ED Diagnoses   Final diagnoses:  Rash and nonspecific skin eruption    New Prescriptions New Prescriptions   No medications on file     Rosey Bathriplett, Jakyle Petrucelli, PA-C 12/19/16 2334    Mesner, Barbara CowerJason, MD 12/22/16 [phone]

## 2017-04-18 LAB — HM DIABETES EYE EXAM

## 2017-09-06 ENCOUNTER — Other Ambulatory Visit: Payer: Self-pay | Admitting: Family Medicine

## 2017-09-07 NOTE — Telephone Encounter (Signed)
30 d ponly needs appt

## 2017-10-17 ENCOUNTER — Telehealth: Payer: Self-pay | Admitting: Family Medicine

## 2017-10-17 ENCOUNTER — Encounter: Payer: Self-pay | Admitting: Family Medicine

## 2017-10-17 ENCOUNTER — Ambulatory Visit (INDEPENDENT_AMBULATORY_CARE_PROVIDER_SITE_OTHER): Payer: Self-pay | Admitting: Family Medicine

## 2017-10-17 VITALS — BP 136/74 | Ht 65.0 in | Wt 167.8 lb

## 2017-10-17 DIAGNOSIS — F330 Major depressive disorder, recurrent, mild: Secondary | ICD-10-CM

## 2017-10-17 DIAGNOSIS — F411 Generalized anxiety disorder: Secondary | ICD-10-CM

## 2017-10-17 DIAGNOSIS — Z111 Encounter for screening for respiratory tuberculosis: Secondary | ICD-10-CM

## 2017-10-17 DIAGNOSIS — E119 Type 2 diabetes mellitus without complications: Secondary | ICD-10-CM

## 2017-10-17 LAB — POCT GLYCOSYLATED HEMOGLOBIN (HGB A1C): HEMOGLOBIN A1C: 9.4

## 2017-10-17 MED ORDER — ALBUTEROL SULFATE HFA 108 (90 BASE) MCG/ACT IN AERS: INHALATION_SPRAY | RESPIRATORY_TRACT | 3 refills | Status: DC

## 2017-10-17 MED ORDER — ESCITALOPRAM OXALATE 10 MG PO TABS: 10.0000 mg | ORAL_TABLET | Freq: Every day | ORAL | 1 refills | Status: DC

## 2017-10-17 MED ORDER — GLIPIZIDE 5 MG PO TABS: ORAL_TABLET | ORAL | 5 refills | Status: DC

## 2017-10-17 NOTE — Telephone Encounter (Signed)
Last prescribed by dr Brett Canales June 2017. Pt states she wanted to start back on xanax. Discussed with dr Brett Canales today and was told that [facility] would have to prescribe med. Pt states she called [facility] and they will not prescribe nerve medication.

## 2017-10-17 NOTE — Progress Notes (Signed)
   Subjective:    Patient ID: Ann Richards, female    DOB: 1991-02-07, 27 y.o.   MRN: [id number]  HPI Pt here for med check and to receive TB test. Need refill on Xanax, Lexapro,  Glipizide, and Ventolin inhaler. TB test needed for pt work.    Results for orders placed or performed in visit on 11/23/16  POCT HgB A1C  Result Value Ref Range   Hemoglobin A1C 10.3    Numbers overall pretty good   Works at in home care    Walking a lot,      Review of Systems No headache, no major weight loss or weight gain, no chest pain no back pain abdominal pain no change in bowel habits complete ROS otherwise negative     Objective:   Physical Exam  Alert vitals stable, NAD. Blood pressure good on repeat. HEENT normal. Lungs clear. Heart regular rate and rhythm.       Assessment & Plan:  Impression1 type 2 diabetes.  Self-care and compliance not good.  Patient missing doses of medications.  Sugars rising at times.  A1c a little better but not much.  Patient did not feel the educational session as recommended.  Did not follow-up in 2 months is recommended instead in 8 months.  2.  Depression with element of anxiety.  Did not follow through with mental health as recommended 2 years ago.  We will attempt this again.  Much appropriate but just for 2 months  Albuterol refill and also diabetes medicine refilled follow-up as scheduled diet exercise discussed compliance once again discussed

## 2017-10-17 NOTE — Telephone Encounter (Signed)
No, pt score extremely high on phq7, I will not be prescribing her mental health meds, she definetly needs a psychiatrist. It is true they try to avoid nerve pills also, lexapro should start to calm down the nerves along with help the depression. Pt has to follow up with a psychiatrist to get proper depressiom care

## 2017-10-17 NOTE — Telephone Encounter (Signed)
Left message to return call to get more info.  

## 2017-10-17 NOTE — Telephone Encounter (Signed)
[facility] said that they will not refill the Xanax, that she would have to call here for that. (Patient seen earlier today and Dr. Brett Canales told her to get the Xanax from [facility]) Now what should patient do.

## 2017-10-18 NOTE — Telephone Encounter (Signed)
Discussed with pt. Pt verbalized understanding.  °

## 2017-10-18 NOTE — Telephone Encounter (Signed)
Left message to return call 

## 2017-10-19 LAB — TB SKIN TEST
INDURATION: 0 mm
TB SKIN TEST: NEGATIVE

## 2017-10-30 ENCOUNTER — Encounter: Payer: Self-pay | Admitting: Family Medicine

## 2017-11-01 ENCOUNTER — Ambulatory Visit (INDEPENDENT_AMBULATORY_CARE_PROVIDER_SITE_OTHER): Payer: Self-pay | Admitting: Family Medicine

## 2017-11-01 ENCOUNTER — Encounter: Payer: Self-pay | Admitting: Family Medicine

## 2017-11-01 VITALS — BP 150/84 | Ht 65.0 in | Wt 171.0 lb

## 2017-11-01 DIAGNOSIS — E119 Type 2 diabetes mellitus without complications: Secondary | ICD-10-CM

## 2017-11-01 NOTE — Progress Notes (Signed)
   Subjective:    Patient ID: Ann Richards, female    DOB: Feb 12, 1991, 27 y.o.   MRN: [id number]  HPI Patient is here today for follow up on her blood sugars with her last A1c done on 10/17/2017 at 9.4.She is on Glipizide 5 mg one BID. She states she does eat healthier, and gets exercise.She just thinks she needs another medication to help with the DM.  Misses med just on occasion  Watching diet fairly closely  Walking regulaly    Morning numbers 100 to 150     Review of Systems No headache, no major weight loss or weight gain, no chest pain no back pain abdominal pain no change in bowel habits complete ROS otherwise negative     Objective:   Physical Exam Alert vitals stable, NAD. Blood pressure good on repeat. HEENT normal. Lungs clear. Heart regular rate and rhythm.       Assessment & Plan:  Impression type 2 diabetes.  Control not good.  Patient working hard on diet morning sugars have dropped into the low 100s.  She still feels she needs to be on an additional medication.  Long discussion held in this regard.  Patient did not realize next step would cause 100s of dollars per month.  For now she wants to maintain her own efforts.  She is also going to explore with [facility] about potential help with medicines management follow-up

## 2018-01-08 ENCOUNTER — Telehealth: Payer: Self-pay | Admitting: Family Medicine

## 2018-01-08 NOTE — Telephone Encounter (Signed)
Pt wants to verify that she had TB test done. CB# [phone]

## 2018-01-08 NOTE — Telephone Encounter (Signed)
Was done may 2nd. Pt notified and copy at the front for pickup.

## 2018-03-05 ENCOUNTER — Ambulatory Visit: Payer: Self-pay | Admitting: Family Medicine

## 2018-03-07 ENCOUNTER — Telehealth: Payer: Self-pay | Admitting: Family Medicine

## 2018-03-07 NOTE — Telephone Encounter (Signed)
Copy of results up front for pick up. Patient notified.

## 2018-03-07 NOTE — Telephone Encounter (Signed)
Pt requesting a copy of TB skin test.

## 2018-03-15 ENCOUNTER — Telehealth: Payer: Self-pay | Admitting: Family Medicine

## 2018-03-15 ENCOUNTER — Encounter: Payer: Self-pay | Admitting: Family Medicine

## 2018-03-15 NOTE — Telephone Encounter (Signed)
Pt requesting copy of TB skin test results.

## 2018-03-15 NOTE — Telephone Encounter (Signed)
Results printed and patient notified. Results up front ready to be picked up

## 2018-04-16 ENCOUNTER — Emergency Department (HOSPITAL_COMMUNITY): Payer: Self-pay

## 2018-04-16 ENCOUNTER — Inpatient Hospital Stay (HOSPITAL_COMMUNITY)
Admission: EM | Admit: 2018-04-16 | Discharge: 2018-04-19 | DRG: 639 | Disposition: A | Discharge status: Home / Self Care | Payer: Self-pay | Attending: Internal Medicine | Admitting: Internal Medicine

## 2018-04-16 ENCOUNTER — Other Ambulatory Visit: Payer: Self-pay

## 2018-04-16 ENCOUNTER — Encounter (HOSPITAL_COMMUNITY): Payer: Self-pay | Admitting: Emergency Medicine

## 2018-04-16 DIAGNOSIS — E11649 Type 2 diabetes mellitus with hypoglycemia without coma: Secondary | ICD-10-CM | POA: Diagnosis not present

## 2018-04-16 DIAGNOSIS — E1310 Other specified diabetes mellitus with ketoacidosis without coma: Secondary | ICD-10-CM

## 2018-04-16 DIAGNOSIS — F411 Generalized anxiety disorder: Secondary | ICD-10-CM | POA: Diagnosis present

## 2018-04-16 DIAGNOSIS — Z888 Allergy status to other drugs, medicaments and biological substances status: Secondary | ICD-10-CM

## 2018-04-16 DIAGNOSIS — N200 Calculus of kidney: Secondary | ICD-10-CM | POA: Diagnosis present

## 2018-04-16 DIAGNOSIS — Z793 Long term (current) use of hormonal contraceptives: Secondary | ICD-10-CM

## 2018-04-16 DIAGNOSIS — F329 Major depressive disorder, single episode, unspecified: Secondary | ICD-10-CM | POA: Diagnosis present

## 2018-04-16 DIAGNOSIS — E1110 Type 2 diabetes mellitus with ketoacidosis without coma: Principal | ICD-10-CM | POA: Diagnosis present

## 2018-04-16 DIAGNOSIS — Z7984 Long term (current) use of oral hypoglycemic drugs: Secondary | ICD-10-CM

## 2018-04-16 DIAGNOSIS — R000 Tachycardia, unspecified: Secondary | ICD-10-CM | POA: Diagnosis present

## 2018-04-16 DIAGNOSIS — Z79899 Other long term (current) drug therapy: Secondary | ICD-10-CM

## 2018-04-16 DIAGNOSIS — F32A Depression, unspecified: Secondary | ICD-10-CM | POA: Diagnosis present

## 2018-04-16 DIAGNOSIS — G4700 Insomnia, unspecified: Secondary | ICD-10-CM | POA: Diagnosis present

## 2018-04-16 DIAGNOSIS — R109 Unspecified abdominal pain: Secondary | ICD-10-CM

## 2018-04-16 DIAGNOSIS — J45909 Unspecified asthma, uncomplicated: Secondary | ICD-10-CM | POA: Diagnosis present

## 2018-04-16 DIAGNOSIS — Z9049 Acquired absence of other specified parts of digestive tract: Secondary | ICD-10-CM

## 2018-04-16 DIAGNOSIS — E860 Dehydration: Secondary | ICD-10-CM | POA: Diagnosis present

## 2018-04-16 DIAGNOSIS — K529 Noninfective gastroenteritis and colitis, unspecified: Secondary | ICD-10-CM | POA: Diagnosis present

## 2018-04-16 DIAGNOSIS — Z87891 Personal history of nicotine dependence: Secondary | ICD-10-CM

## 2018-04-16 DIAGNOSIS — A084 Viral intestinal infection, unspecified: Secondary | ICD-10-CM | POA: Diagnosis present

## 2018-04-16 DIAGNOSIS — D72829 Elevated white blood cell count, unspecified: Secondary | ICD-10-CM

## 2018-04-16 LAB — URINALYSIS, ROUTINE W REFLEX MICROSCOPIC
BACTERIA UA: NONE SEEN
BILIRUBIN URINE: NEGATIVE
HGB URINE DIPSTICK: NEGATIVE
Ketones, ur: 80 mg/dL — AB
LEUKOCYTES UA: NEGATIVE
NITRITE: NEGATIVE
Protein, ur: 100 mg/dL — AB
SPECIFIC GRAVITY, URINE: 1.027 (ref 1.005–1.030)
pH: 5 (ref 5.0–8.0)

## 2018-04-16 LAB — COMPREHENSIVE METABOLIC PANEL
ALBUMIN: 3.4 g/dL — AB (ref 3.5–5.0)
ALT: 20 U/L (ref 0–44)
AST: 14 U/L — AB (ref 15–41)
Alkaline Phosphatase: 115 U/L (ref 38–126)
Anion gap: >20 — ABNORMAL HIGH (ref 5–15)
BUN: 16 mg/dL (ref 6–20)
CHLORIDE: 94 mmol/L — AB (ref 98–111)
CO2: 12 mmol/L — AB (ref 22–32)
Calcium: 9.2 mg/dL (ref 8.9–10.3)
Creatinine, Ser: 0.77 mg/dL (ref 0.44–1.00)
GFR calc Af Amer: >60 mL/min (ref >60)
Glucose, Bld: 368 mg/dL — ABNORMAL HIGH (ref 70–99)
POTASSIUM: 3.6 mmol/L (ref 3.5–5.1)
SODIUM: 128 mmol/L — AB (ref 135–145)
TOTAL PROTEIN: 8.9 g/dL — AB (ref 6.5–8.1)
Total Bilirubin: 1.3 mg/dL — ABNORMAL HIGH (ref 0.3–1.2)

## 2018-04-16 LAB — PREGNANCY, URINE: Preg Test, Ur: NEGATIVE

## 2018-04-16 LAB — CBC
HEMATOCRIT: 44.3 % (ref 36.0–46.0)
HEMOGLOBIN: 13.8 g/dL (ref 12.0–15.0)
MCH: 25.5 pg — ABNORMAL LOW (ref 26.0–34.0)
MCHC: 31.2 g/dL (ref 30.0–36.0)
MCV: 81.9 fL (ref 80.0–100.0)
NRBC: 0 % (ref 0.0–0.2)
Platelets: 294 10*3/uL (ref 150–400)
RBC: 5.41 MIL/uL — ABNORMAL HIGH (ref 3.87–5.11)
RDW: 14 % (ref 11.5–15.5)
WBC: 22.7 10*3/uL — ABNORMAL HIGH (ref 4.0–10.5)

## 2018-04-16 LAB — LIPASE, BLOOD: LIPASE: 15 U/L (ref 11–51)

## 2018-04-16 MED ORDER — IOPAMIDOL (ISOVUE-300) INJECTION 61%
100.0000 mL | Freq: Once | INTRAVENOUS | Status: AC | PRN
  Administered 2018-04-16: 75 mL via INTRAVENOUS

## 2018-04-16 MED ORDER — SODIUM CHLORIDE 0.9 % IV BOLUS
1000.0000 mL | Freq: Once | INTRAVENOUS | Status: AC
Start: 2018-04-16 — End: 2018-04-17
  Administered 2018-04-16: 1000 mL via INTRAVENOUS

## 2018-04-16 MED ORDER — ONDANSETRON HCL 4 MG/2ML IJ SOLN
4.0000 mg | Freq: Once | INTRAMUSCULAR | Status: AC
  Administered 2018-04-16: 4 mg via INTRAVENOUS
  Filled 2018-04-16: qty 2

## 2018-04-16 MED ORDER — SODIUM CHLORIDE 0.9 % IV BOLUS
1000.0000 mL | Freq: Once | INTRAVENOUS | Status: AC
  Administered 2018-04-16: 1000 mL via INTRAVENOUS

## 2018-04-16 MED ORDER — MORPHINE SULFATE (PF) 4 MG/ML IV SOLN
4.0000 mg | Freq: Once | INTRAVENOUS | Status: AC
  Administered 2018-04-16: 4 mg via INTRAVENOUS
  Filled 2018-04-16: qty 1

## 2018-04-16 NOTE — ED Triage Notes (Signed)
Pt c/o mid upper and lower abd x 3 days with n/v/d

## 2018-04-17 ENCOUNTER — Inpatient Hospital Stay (HOSPITAL_COMMUNITY): Payer: Self-pay

## 2018-04-17 ENCOUNTER — Encounter (HOSPITAL_COMMUNITY): Payer: Self-pay | Admitting: Internal Medicine

## 2018-04-17 DIAGNOSIS — E1110 Type 2 diabetes mellitus with ketoacidosis without coma: Secondary | ICD-10-CM | POA: Diagnosis present

## 2018-04-17 DIAGNOSIS — E1010 Type 1 diabetes mellitus with ketoacidosis without coma: Secondary | ICD-10-CM

## 2018-04-17 LAB — GLUCOSE, CAPILLARY
GLUCOSE-CAPILLARY: 224 mg/dL — AB (ref 70–99)
GLUCOSE-CAPILLARY: 194 mg/dL — AB (ref 70–99)
GLUCOSE-CAPILLARY: 138 mg/dL — AB (ref 70–99)
GLUCOSE-CAPILLARY: 146 mg/dL — AB (ref 70–99)
GLUCOSE-CAPILLARY: 185 mg/dL — AB (ref 70–99)
GLUCOSE-CAPILLARY: 175 mg/dL — AB (ref 70–99)
Glucose-Capillary: 242 mg/dL — ABNORMAL HIGH (ref 70–99)
Glucose-Capillary: 222 mg/dL — ABNORMAL HIGH (ref 70–99)
Glucose-Capillary: 156 mg/dL — ABNORMAL HIGH (ref 70–99)
Glucose-Capillary: 130 mg/dL — ABNORMAL HIGH (ref 70–99)
Glucose-Capillary: 154 mg/dL — ABNORMAL HIGH (ref 70–99)
Glucose-Capillary: 180 mg/dL — ABNORMAL HIGH (ref 70–99)

## 2018-04-17 LAB — BASIC METABOLIC PANEL
Anion gap: 17 — ABNORMAL HIGH (ref 5–15)
Anion gap: 10 (ref 5–15)
Anion gap: 9 (ref 5–15)
Anion gap: 10 (ref 5–15)
Anion gap: 11 (ref 5–15)
BUN: 12 mg/dL (ref 6–20)
BUN: 10 mg/dL (ref 6–20)
BUN: 8 mg/dL (ref 6–20)
BUN: 8 mg/dL (ref 6–20)
BUN: 8 mg/dL (ref 6–20)
CALCIUM: 7.9 mg/dL — AB (ref 8.9–10.3)
CALCIUM: 7.7 mg/dL — AB (ref 8.9–10.3)
CALCIUM: 7.8 mg/dL — AB (ref 8.9–10.3)
CALCIUM: 7.9 mg/dL — AB (ref 8.9–10.3)
CO2: 12 mmol/L — ABNORMAL LOW (ref 22–32)
CO2: 17 mmol/L — ABNORMAL LOW (ref 22–32)
CO2: 19 mmol/L — ABNORMAL LOW (ref 22–32)
CO2: 19 mmol/L — ABNORMAL LOW (ref 22–32)
CO2: 20 mmol/L — ABNORMAL LOW (ref 22–32)
CREATININE: 0.59 mg/dL (ref 0.44–1.00)
CREATININE: 0.59 mg/dL (ref 0.44–1.00)
Calcium: 8.2 mg/dL — ABNORMAL LOW (ref 8.9–10.3)
Chloride: 101 mmol/L (ref 98–111)
Chloride: 104 mmol/L (ref 98–111)
Chloride: 105 mmol/L (ref 98–111)
Chloride: 102 mmol/L (ref 98–111)
Chloride: 101 mmol/L (ref 98–111)
Creatinine, Ser: 0.74 mg/dL (ref 0.44–1.00)
Creatinine, Ser: 0.53 mg/dL (ref 0.44–1.00)
Creatinine, Ser: 0.43 mg/dL — ABNORMAL LOW (ref 0.44–1.00)
GFR calc Af Amer: >60 mL/min (ref >60)
GFR calc Af Amer: >60 mL/min (ref >60)
GFR calc Af Amer: >60 mL/min (ref >60)
GFR calc Af Amer: >60 mL/min (ref >60)
GFR calc Af Amer: >60 mL/min (ref >60)
GFR calc non Af Amer: >60 mL/min (ref >60)
GFR calc non Af Amer: >60 mL/min (ref >60)
GLUCOSE: 215 mg/dL — AB (ref 70–99)
GLUCOSE: 200 mg/dL — AB (ref 70–99)
Glucose, Bld: 280 mg/dL — ABNORMAL HIGH (ref 70–99)
Glucose, Bld: 241 mg/dL — ABNORMAL HIGH (ref 70–99)
Glucose, Bld: 137 mg/dL — ABNORMAL HIGH (ref 70–99)
POTASSIUM: 4 mmol/L (ref 3.5–5.1)
POTASSIUM: 3.4 mmol/L — AB (ref 3.5–5.1)
POTASSIUM: 3.1 mmol/L — AB (ref 3.5–5.1)
Potassium: 3 mmol/L — ABNORMAL LOW (ref 3.5–5.1)
Potassium: 3.6 mmol/L (ref 3.5–5.1)
SODIUM: 130 mmol/L — AB (ref 135–145)
SODIUM: 131 mmol/L — AB (ref 135–145)
SODIUM: 131 mmol/L — AB (ref 135–145)
SODIUM: 132 mmol/L — AB (ref 135–145)
Sodium: 133 mmol/L — ABNORMAL LOW (ref 135–145)

## 2018-04-17 LAB — MAGNESIUM: Magnesium: 1.9 mg/dL (ref 1.7–2.4)

## 2018-04-17 LAB — MRSA PCR SCREENING: MRSA by PCR: NEGATIVE

## 2018-04-17 LAB — HEMOGLOBIN A1C
HEMOGLOBIN A1C: 10.9 % — AB (ref 4.8–5.6)
MEAN PLASMA GLUCOSE: 266.13 mg/dL

## 2018-04-17 LAB — CBG MONITORING, ED
GLUCOSE-CAPILLARY: 279 mg/dL — AB (ref 70–99)
Glucose-Capillary: 280 mg/dL — ABNORMAL HIGH (ref 70–99)

## 2018-04-17 MED ORDER — POTASSIUM CHLORIDE CRYS ER 20 MEQ PO TBCR
40.0000 meq | EXTENDED_RELEASE_TABLET | ORAL | Status: AC
  Administered 2018-04-17 (×3): 40 meq via ORAL
  Filled 2018-04-17 (×3): qty 2

## 2018-04-17 MED ORDER — GLIPIZIDE 5 MG PO TABS
5.0000 mg | ORAL_TABLET | Freq: Two times a day (BID) | ORAL | Status: DC
  Administered 2018-04-17 – 2018-04-19 (×4): 5 mg via ORAL
  Filled 2018-04-17 (×4): qty 1

## 2018-04-17 MED ORDER — ALBUTEROL SULFATE (2.5 MG/3ML) 0.083% IN NEBU
3.0000 mL | INHALATION_SOLUTION | RESPIRATORY_TRACT | Status: DC | PRN
  Filled 2018-04-17: qty 3

## 2018-04-17 MED ORDER — ALPRAZOLAM 0.5 MG PO TABS
0.5000 mg | ORAL_TABLET | Freq: Every day | ORAL | Status: DC | PRN
  Administered 2018-04-17 – 2018-04-19 (×3): 0.5 mg via ORAL
  Filled 2018-04-17 (×3): qty 1

## 2018-04-17 MED ORDER — INSULIN REGULAR(HUMAN) IN NACL 100-0.9 UT/100ML-% IV SOLN
INTRAVENOUS | Status: DC
  Filled 2018-04-17: qty 100

## 2018-04-17 MED ORDER — POTASSIUM CHLORIDE 10 MEQ/100ML IV SOLN
10.0000 meq | INTRAVENOUS | Status: AC
  Administered 2018-04-17 (×2): 10 meq via INTRAVENOUS
  Filled 2018-04-17 (×2): qty 100

## 2018-04-17 MED ORDER — INSULIN GLARGINE 100 UNIT/ML ~~LOC~~ SOLN
20.0000 [IU] | SUBCUTANEOUS | Status: AC
  Administered 2018-04-17: 20 [IU] via SUBCUTANEOUS
  Filled 2018-04-17: qty 0.2

## 2018-04-17 MED ORDER — ESCITALOPRAM OXALATE 10 MG PO TABS
10.0000 mg | ORAL_TABLET | Freq: Every day | ORAL | Status: DC
  Administered 2018-04-18 – 2018-04-19 (×2): 10 mg via ORAL
  Filled 2018-04-17 (×3): qty 1

## 2018-04-17 MED ORDER — ACETAMINOPHEN 325 MG PO TABS
650.0000 mg | ORAL_TABLET | Freq: Four times a day (QID) | ORAL | Status: DC | PRN
  Administered 2018-04-17 – 2018-04-19 (×8): 650 mg via ORAL
  Filled 2018-04-17 (×8): qty 2

## 2018-04-17 MED ORDER — INSULIN ASPART 100 UNIT/ML ~~LOC~~ SOLN: 0.0000 [IU] | Freq: Every day | SUBCUTANEOUS | Status: DC

## 2018-04-17 MED ORDER — INSULIN REGULAR(HUMAN) IN NACL 100-0.9 UT/100ML-% IV SOLN
INTRAVENOUS | Status: DC
  Administered 2018-04-17: 2.2 [IU]/h via INTRAVENOUS
  Filled 2018-04-17 (×2): qty 100

## 2018-04-17 MED ORDER — DEXTROSE-NACL 5-0.45 % IV SOLN
INTRAVENOUS | Status: DC
  Administered 2018-04-17: 03:00:00 via INTRAVENOUS

## 2018-04-17 MED ORDER — TRAMADOL HCL 50 MG PO TABS
50.0000 mg | ORAL_TABLET | Freq: Four times a day (QID) | ORAL | Status: DC | PRN
  Administered 2018-04-17 – 2018-04-19 (×9): 50 mg via ORAL
  Filled 2018-04-17 (×9): qty 1

## 2018-04-17 MED ORDER — SODIUM CHLORIDE 0.9 % IV SOLN: INTRAVENOUS | Status: DC

## 2018-04-17 MED ORDER — INSULIN ASPART 100 UNIT/ML ~~LOC~~ SOLN
0.0000 [IU] | Freq: Three times a day (TID) | SUBCUTANEOUS | Status: DC
  Administered 2018-04-17 – 2018-04-18 (×3): 4 [IU] via SUBCUTANEOUS
  Administered 2018-04-18: 11 [IU] via SUBCUTANEOUS
  Administered 2018-04-18 – 2018-04-19 (×2): 4 [IU] via SUBCUTANEOUS

## 2018-04-17 MED ORDER — POLYVINYL ALCOHOL 1.4 % OP SOLN: 1.0000 [drp] | Freq: Three times a day (TID) | OPHTHALMIC | Status: DC | PRN

## 2018-04-17 MED ORDER — LACTATED RINGERS IV SOLN
INTRAVENOUS | Status: DC
  Administered 2018-04-17 – 2018-04-19 (×6): via INTRAVENOUS

## 2018-04-17 MED ORDER — PROMETHAZINE HCL 25 MG/ML IJ SOLN: 12.5000 mg | Freq: Four times a day (QID) | INTRAMUSCULAR | Status: DC | PRN

## 2018-04-17 MED ORDER — DEXTROSE-NACL 5-0.45 % IV SOLN: INTRAVENOUS | Status: DC

## 2018-04-17 MED ORDER — ONDANSETRON HCL 4 MG/2ML IJ SOLN
4.0000 mg | Freq: Four times a day (QID) | INTRAMUSCULAR | Status: DC | PRN
  Administered 2018-04-18: 4 mg via INTRAVENOUS
  Filled 2018-04-17: qty 2

## 2018-04-17 MED ORDER — HYPROMELLOSE (GONIOSCOPIC) 2.5 % OP SOLN
1.0000 [drp] | Freq: Three times a day (TID) | OPHTHALMIC | Status: DC | PRN
  Filled 2018-04-17: qty 15

## 2018-04-17 MED ORDER — ENOXAPARIN SODIUM 40 MG/0.4ML ~~LOC~~ SOLN
40.0000 mg | SUBCUTANEOUS | Status: DC
  Administered 2018-04-18: 40 mg via SUBCUTANEOUS
  Filled 2018-04-17 (×2): qty 0.4

## 2018-04-17 NOTE — H&P (Signed)
TRH H&P   Patient Demographics:    Ann Richards, is a 27 y.o. female  MRN: [id number]   DOB - 07/06/1990  Admit Date - 04/16/2018  Outpatient Primary MD for the patient is Ann Diss Vilinda Blanks, MD  Referring MD/NP/PA:   [name]  Outpatient Specialists:     Patient coming from: home  Chief Complaint  Patient presents with  . Abdominal Pain      HPI:    Ann Richards  is a 27 y.o. femalew Dm1, anxiety/ depression c/o n/v, 1-2x per day (no blood) starting Friday associated with loose stool. Slight abdominal cramping. Pt denies fever, chills, cough, cp, palp, sob, brbpr, black stool. Pt presented due to not feeling well.  Pt denies noncompliance with medication, she is taking glipizide.    In ED,  T 97.6, P 122, Bp 127/78  Pox 98% RA  CT scan abd/ pelvis IMPRESSION: Several thick-walled small bowel loops over the left upper quadrant and lower abdomen/pelvis. Associated patchy mesenteric fluid/ascites. No free peritoneal air. Findings are likely due to regional enteritis of inflammatory or infectious nature.  6 mm nonobstructing left renal stone with mild left renal cortical scarring unchanged.   Na 128, K 3.6,  Bun 16, Creatinine 0.77   Glucose 368 Hco3 12 Ast 14, Alt 20, Alb 3.4  Wbc 22.7, Hgb 13.8, Plt 294  Urinalysis + ketones  Urine preg negative  CXR pending  Pt will be admitted for DKA      Review of systems:    In addition to the HPI above,  No Fever-chills, No Headache, No changes with Vision or hearing, No problems swallowing food or Liquids, No Chest pain, Cough or Shortness of Breath,  No Blood in stool or Urine, No dysuria, No new skin rashes or bruises, No new joints pains-aches,  No new weakness, tingling, numbness in any extremity, No recent weight gain or loss, No polyuria, polydypsia or polyphagia, No  significant Mental Stressors.  A full 10 point Review of Systems was done, except as stated above, all other Review of Systems were negative.   With Past History of the following :    Past Medical History:  Diagnosis Date  . Anxiety   . Depression   . Diabetes mellitus without complication (HCC)   . Insomnia       Past Surgical History:  Procedure Laterality Date  . LAPAROSCOPIC APPENDECTOMY N/A 08/25/2016   Procedure: APPENDECTOMY LAPAROSCOPIC;  Surgeon: Ann Macho, MD;  Location: [facility];  Service: General;  Laterality: N/A;      Social History:     Social History   Tobacco Use  . Smoking status: Former Smoker    Packs/day: 0.25    Years: 1.00    Pack years: 0.25    Types: Cigarettes    Start date: 03/14/2008    Last attempt to quit: 05/14/2009  Years since quitting: 8.9  . Smokeless tobacco: Never Used  Substance Use Topics  . Alcohol use: Yes    Alcohol/week: 0.0 standard drinks    Comment: ocassional     Lives - at home  Mobility - walks by self   Family History :     Family History  Problem Relation Age of Onset  . Diabetes Father       Home Medications:   Prior to Admission medications   Medication Sig Start Date End Date Taking? Authorizing Provider  acetaminophen (TYLENOL) 500 MG tablet Take 500 mg by mouth every 6 (six) hours as needed for mild pain or moderate pain.   Yes [provider]  albuterol (VENTOLIN HFA) 108 (90 Base) MCG/ACT inhaler INHALE TWO PUFFS INTO THE LUNGS EVERY 4 HOURS AS NEEDED Patient taking differently: Inhale 2 puffs into the lungs every 4 (four) hours as needed for wheezing or shortness of breath.  10/17/17  Yes [name], MD  ALPRAZolam Prudy Feeler) 0.5 MG tablet TAKE ONE TABLET BY MOUTH ONCE DAILY AS NEEDED FOR ANXIETY/INSOMNIA. USE SPARINGLY Patient taking differently: Take 0.5 mg by mouth daily as needed for anxiety.  12/15/15  Yes [name], MD  bismuth subsalicylate (PEPTO BISMOL) 262 MG/15ML  suspension Take 30 mLs by mouth every 6 (six) hours as needed for indigestion or diarrhea or loose stools.   Yes [provider]  Etonogestrel (IMPLANON [place]) Inject into the skin once.   Yes [provider]  Fructose-Dextrose-Phosphor Acd (SB ANTI-NAUSEA PO) Take 1 capsule by mouth once as needed ([employer] BRAND-OTC).   Yes [provider]  glipiZIDE (GLUCOTROL) 5 MG tablet TAKE 1 TABLET BY MOUTH TWICE DAILY BEFORE  A  MEAL Patient taking differently: Take 5 mg by mouth 2 (two) times daily before a meal.  10/17/17  Yes [name], MD  hydroxypropyl methylcellulose / hypromellose (ISOPTO TEARS / GONIOVISC) 2.5 % ophthalmic solution Place 1 drop into both eyes 3 (three) times daily as needed for dry eyes.   Yes [provider]  ibuprofen (ADVIL,MOTRIN) 200 MG tablet Take 600-800 mg by mouth every 6 (six) hours as needed for moderate pain.   Yes [provider]  Multiple Vitamin (MULTIVITAMIN WITH MINERALS) TABS tablet Take 1 tablet by mouth daily.   Yes [provider]  escitalopram (LEXAPRO) 10 MG tablet Take 1 tablet (10 mg total) by mouth daily. Patient not taking: Reported on 04/16/2018 10/17/17   [name], MD     Allergies:     Allergies  Allergen Reactions  . Metformin And Related     Sensitivity to med; stomach issues     Physical Exam:   Vitals  Blood pressure 127/78, pulse (!) 122, temperature 97.6 F (36.4 C), temperature source Oral, resp. rate 18, height 5\' 1"  (1.549 m), weight 68 kg, last menstrual period 04/05/2018, SpO2 99 %.   1. General  lying in bed in NAD,   2. Normal affect and insight, Not Suicidal or Homicidal, Awake Alert, Oriented X 3.  3. No F.N deficits, ALL C.Nerves Intact, Strength 5/5 all 4 extremities, Sensation intact all 4 extremities, Plantars down going.  4. Ears and Eyes appear Normal, Conjunctivae clear, PERRLA. Moist Oral Mucosa.  5. Supple Neck, No JVD, No cervical  lymphadenopathy appriciated, No Carotid Bruits.  6. Symmetrical Chest wall movement, Good air movement bilaterally, CTAB.  7. RRR, No Gallops, Rubs or Murmurs, No Parasternal Heave.  8. Positive Bowel Sounds, Abdomen Soft,  No tenderness, No organomegaly appriciated,No rebound -guarding or rigidity.  9.  No Cyanosis, Normal Skin Turgor, No Skin Rash or Bruise.  10. Good muscle tone,  joints appear normal , no effusions, Normal ROM.  11. No Palpable Lymph Nodes in Neck or Axillae     Data Review:    CBC Recent Labs  Lab 04/16/18 2016  WBC 22.7*  HGB 13.8  HCT 44.3  PLT 294  MCV 81.9  MCH 25.5*  MCHC 31.2  RDW 14.0   ------------------------------------------------------------------------------------------------------------------  Chemistries  Recent Labs  Lab 04/16/18 2016  NA 128*  K 3.6  CL 94*  CO2 12*  GLUCOSE 368*  BUN 16  CREATININE 0.77  CALCIUM 9.2  AST 14*  ALT 20  ALKPHOS 115  BILITOT 1.3*   ------------------------------------------------------------------------------------------------------------------ estimated creatinine clearance is 92.4 mL/min (by C-G formula based on SCr of 0.77 mg/dL). ------------------------------------------------------------------------------------------------------------------ No results for input(s): TSH, T4TOTAL, T3FREE, THYROIDAB in the last 72 hours.  Invalid input(s): FREET3  Coagulation profile No results for input(s): INR, PROTIME in the last 168 hours. ------------------------------------------------------------------------------------------------------------------- No results for input(s): DDIMER in the last 72 hours. -------------------------------------------------------------------------------------------------------------------  Cardiac Enzymes No results for input(s): CKMB, TROPONINI, MYOGLOBIN in the last 168 hours.  Invalid input(s):  CK ------------------------------------------------------------------------------------------------------------------ No results found for: BNP   ---------------------------------------------------------------------------------------------------------------  Urinalysis    Component Value Date/Time   COLORURINE YELLOW 04/16/2018 2215   APPEARANCEUR CLEAR 04/16/2018 2215   LABSPEC 1.027 04/16/2018 2215   PHURINE 5.0 04/16/2018 2215   GLUCOSEU >=500 (A) 04/16/2018 2215   HGBUR NEGATIVE 04/16/2018 2215   BILIRUBINUR NEGATIVE 04/16/2018 2215   KETONESUR 80 (A) 04/16/2018 2215   PROTEINUR 100 (A) 04/16/2018 2215   UROBILINOGEN 0.2 09/10/2010 2351   NITRITE NEGATIVE 04/16/2018 2215   LEUKOCYTESUR NEGATIVE 04/16/2018 2215    ----------------------------------------------------------------------------------------------------------------   Imaging Results:    Ct Abdomen Pelvis W Contrast  Result Date: 04/17/2018 CLINICAL DATA:  Mid to upper and lower abdominal pain 3 days with nausea, vomiting and diarrhea. EXAM: CT ABDOMEN AND PELVIS WITH CONTRAST TECHNIQUE: Multidetector CT imaging of the abdomen and pelvis was performed using the standard protocol following bolus administration of intravenous contrast. CONTRAST:  75mL ISOVUE-300 IOPAMIDOL (ISOVUE-300) INJECTION 61% COMPARISON:  08/25/2016 and 09/11/2010 FINDINGS: Lower chest: Minimal linear atelectasis right base. Hepatobiliary: Liver, gallbladder and biliary tree are normal. Pancreas: Normal. Spleen: Normal Adrenals/Urinary Tract: Adrenal glands are normal. Kidneys normal in size with mild focal renal cortical scarring over the upper pole left kidney with an associated 6 mm calcification unchanged. Ureters and bladder are normal. Stomach/Bowel: Stomach is normal. There are several thick-walled small bowel loops in the left upper quadrant. There are a few thick-walled small bowel loops over the lower abdomen/pelvis. There is patchy mesenteric  fluid. No dilated small bowel loops. Previous appendectomy. Air and stool over the right colon and transverse colon as the descending and rectosigmoid colon are decompressed. Vascular/Lymphatic: Normal. Reproductive: Uterus and ovaries are normal. Other: None. Musculoskeletal: Within normal. IMPRESSION: Several thick-walled small bowel loops over the left upper quadrant and lower abdomen/pelvis. Associated patchy mesenteric fluid/ascites. No free peritoneal air. Findings are likely due to regional enteritis of inflammatory or infectious nature. 6 mm nonobstructing left renal stone with mild left renal cortical scarring unchanged. Electronically Signed   By: Elberta Fortis M.D.   On: 04/17/2018 00:18       Assessment & Plan:    Principal Problem:   DKA (diabetic ketoacidoses) (HCC) Active Problems:   Generalized anxiety disorder  Depression    DKA NPO Hydrate with ns iv Bmp q4h, x5 Please change to [place] insulin once AG and Hco3 normalize  DM1 STOP glipizide Insulin as above  N/v Zofran 4mg  iv q6h prn  Phenergan 12.5mg  iv q6h prn if zofran not effective  Diarrhea, resolved per pt, monitor CT scan showed enteritis.   Anxiety/ Depression Lexapro 10mg  po qday Xanax prn  Asthma Albuterol HFA prn   DVT Prophylaxis  Lovenox - SCDs   AM Labs Ordered, also please review Full Orders  Family Communication: Admission, patients condition and plan of care including tests being ordered have been discussed with the patient  who indicate understanding and agree with the plan and Code Status.  Code Status  FULL CODE  Likely DC to  home  Condition  Critical   Consults called:  none  Admission status: observation  Time spent in minutes : 55  Minutes critical care   Pearson Grippe M.D on 04/17/2018 at 2:01 AM  Between 7am to 7pm - Pager - [phone]  . After 7pm go to [email or web address] - password [facility]  [facility] - Office  [phone]

## 2018-04-17 NOTE — ED Provider Notes (Signed)
[facility] EMERGENCY DEPARTMENT Provider Note   CSN: [id number] Arrival date & time: 04/16/18  1910     History   Chief Complaint Chief Complaint  Patient presents with  . Abdominal Pain    HPI Ann Richards is a 27 y.o. female.  Multiple episodes of nausea, vomiting, diarrhea since early Friday morning with associated bilateral lower abdominal pain.  Patient is a type II diabetic.  She has not taken her diabetes medicine since Thursday.  Last menstrual period just ended.  She is not currently sexually active.  No previous pregnancies.  She feels dehydrated.  No fever, sweats, chills, dysuria, vaginal bleeding, vaginal discharge.  Severity of symptoms is moderate.  Nothing makes symptoms better or worse.     Past Medical History:  Diagnosis Date  . Anxiety   . Depression   . Diabetes mellitus without complication (HCC)   . Insomnia     Patient Active Problem List   Diagnosis Date Noted  . Appendicitis 08/25/2016  . Acute appendicitis with localized peritonitis   . Generalized anxiety disorder 11/16/2015  . Depression 11/16/2015    Past Surgical History:  Procedure Laterality Date  . LAPAROSCOPIC APPENDECTOMY N/A 08/25/2016   Procedure: APPENDECTOMY LAPAROSCOPIC;  Surgeon: Franky Macho, MD;  Location: [facility];  Service: General;  Laterality: N/A;     OB History   None      Home Medications    Prior to Admission medications   Medication Sig Start Date End Date Taking? Authorizing Provider  acetaminophen (TYLENOL) 500 MG tablet Take 500 mg by mouth every 6 (six) hours as needed for mild pain or moderate pain.   Yes [provider]  albuterol (VENTOLIN HFA) 108 (90 Base) MCG/ACT inhaler INHALE TWO PUFFS INTO THE LUNGS EVERY 4 HOURS AS NEEDED Patient taking differently: Inhale 2 puffs into the lungs every 4 (four) hours as needed for wheezing or shortness of breath.  10/17/17  Yes [name], MD  ALPRAZolam Prudy Feeler) 0.5 MG tablet TAKE ONE  TABLET BY MOUTH ONCE DAILY AS NEEDED FOR ANXIETY/INSOMNIA. USE SPARINGLY Patient taking differently: Take 0.5 mg by mouth daily as needed for anxiety.  12/15/15  Yes [name], MD  bismuth subsalicylate (PEPTO BISMOL) 262 MG/15ML suspension Take 30 mLs by mouth every 6 (six) hours as needed for indigestion or diarrhea or loose stools.   Yes [provider]  Etonogestrel (IMPLANON [place]) Inject into the skin once.   Yes [provider]  Fructose-Dextrose-Phosphor Acd (SB ANTI-NAUSEA PO) Take 1 capsule by mouth once as needed ([employer] BRAND-OTC).   Yes [provider]  glipiZIDE (GLUCOTROL) 5 MG tablet TAKE 1 TABLET BY MOUTH TWICE DAILY BEFORE  A  MEAL Patient taking differently: Take 5 mg by mouth 2 (two) times daily before a meal.  10/17/17  Yes [name], MD  hydroxypropyl methylcellulose / hypromellose (ISOPTO TEARS / GONIOVISC) 2.5 % ophthalmic solution Place 1 drop into both eyes 3 (three) times daily as needed for dry eyes.   Yes [provider]  ibuprofen (ADVIL,MOTRIN) 200 MG tablet Take 600-800 mg by mouth every 6 (six) hours as needed for moderate pain.   Yes [provider]  Multiple Vitamin (MULTIVITAMIN WITH MINERALS) TABS tablet Take 1 tablet by mouth daily.   Yes [provider]  escitalopram (LEXAPRO) 10 MG tablet Take 1 tablet (10 mg total) by mouth daily. Patient not taking: Reported on 04/16/2018 10/17/17   [name], MD  Family History History reviewed. No pertinent family history.  Social History Social History   Tobacco Use  . Smoking status: Former Smoker    Packs/day: 0.25    Years: 1.00    Pack years: 0.25    Types: Cigarettes    Start date: 03/14/2008    Last attempt to quit: 05/14/2009    Years since quitting: 8.9  . Smokeless tobacco: Never Used  Substance Use Topics  . Alcohol use: Yes    Alcohol/week: 0.0 standard drinks    Comment: ocassional  . Drug use: No     Allergies     Metformin and related   Review of Systems Review of Systems  All other systems reviewed and are negative.    Physical Exam Updated Vital Signs BP 136/89   Pulse (!) 128   Temp 97.6 F (36.4 C) (Oral)   Resp 18   Ht 5\' 1"  (1.549 m)   Wt 68 kg   LMP 04/05/2018   SpO2 100%   BMI 28.34 kg/m   Physical Exam  Constitutional: She is oriented to person, place, and time.  Dehydrated, but nontoxic-appearing  HENT:  Head: Normocephalic and atraumatic.  Eyes: Conjunctivae are normal.  Neck: Neck supple.  Cardiovascular: Normal rate and regular rhythm.  Pulmonary/Chest: Effort normal and breath sounds normal.  Abdominal: Soft. Bowel sounds are normal.  Minimal generalized abdominal tenderness, worse in lower quadrants.  Musculoskeletal: Normal range of motion.  Neurological: She is alert and oriented to person, place, and time.  Skin: Skin is warm and dry.  Psychiatric: She has a normal mood and affect. Her behavior is normal.  Nursing note and vitals reviewed.    ED Treatments / Results  Labs (all labs ordered are listed, but only abnormal results are displayed) Labs Reviewed  COMPREHENSIVE METABOLIC PANEL - Abnormal; Notable for the following components:      Result Value   Sodium 128 (*)    Chloride 94 (*)    CO2 12 (*)    Glucose, Bld 368 (*)    Total Protein 8.9 (*)    Albumin 3.4 (*)    AST 14 (*)    Total Bilirubin 1.3 (*)    Anion gap >20 (*)    All other components within normal limits  CBC - Abnormal; Notable for the following components:   WBC 22.7 (*)    RBC 5.41 (*)    MCH 25.5 (*)    All other components within normal limits  URINALYSIS, ROUTINE W REFLEX MICROSCOPIC - Abnormal; Notable for the following components:   Glucose, UA >=500 (*)    Ketones, ur 80 (*)    Protein, ur 100 (*)    All other components within normal limits  LIPASE, BLOOD  PREGNANCY, URINE    EKG None  Radiology No results found.  Procedures Procedures (including  critical care time)  Medications Ordered in ED Medications  sodium chloride 0.9 % bolus 1,000 mL (1,000 mLs Intravenous New Bag/Given 04/16/18 2326)  sodium chloride 0.9 % bolus 1,000 mL (1,000 mLs Intravenous New Bag/Given 04/16/18 2324)  ondansetron (ZOFRAN) injection 4 mg (4 mg Intravenous Given 04/16/18 2323)  morphine 4 MG/ML injection 4 mg (4 mg Intravenous Given 04/16/18 2323)  iopamidol (ISOVUE-300) 61 % injection 100 mL (75 mLs Intravenous Contrast Given 04/16/18 2305)     Initial Impression / Assessment and Plan / ED Course  I have reviewed the triage vital signs and the nursing notes.  Pertinent labs & imaging results  that were available during my care of the patient were reviewed by me and considered in my medical decision making (see chart for details).     Patient presents with nausea, vomiting, diarrhea with associated abdominal pain.  She appears to be in DKA.  IV fluids and IV insulin started.  CT abdomen/pelvis pending. Disc c Dr Wilkie Aye.   CRITICAL CARE Performed by: [name] Total critical care time: 30 minutes Critical care time was exclusive of separately billable procedures and treating other patients. Critical care was necessary to treat or prevent imminent or life-threatening deterioration. Critical care was time spent personally by me on the following activities: development of treatment plan with patient and/or surrogate as well as nursing, discussions with consultants, evaluation of patient's response to treatment, examination of patient, obtaining history from patient or surrogate, ordering and performing treatments and interventions, ordering and review of laboratory studies, ordering and review of radiographic studies, pulse oximetry and re-evaluation of patient's condition.  Final Clinical Impressions(s) / ED Diagnoses   Final diagnoses:  Diabetic ketoacidosis without coma associated with other specified diabetes mellitus (HCC)  Abdominal pain,  unspecified abdominal location    ED Discharge Orders    None       [name], MD 04/17/18 0006

## 2018-04-17 NOTE — Progress Notes (Signed)
Patient admitted to the hospital earlier this morning by Dr. Selena Batten.  Patient seen and examined.  Her vomiting and diarrhea have improved.  She wants to eat something.  Denies any abdominal pain.  Exam is otherwise unremarkable.  She is mildly tachycardic.  Patient was admitted to the hospital after having what I suspect a gastroenteritis with vomiting and diarrhea.  She reports she is unable to take her glipizide for the past several days.  She came in with metabolic acidosis and hyperglycemia consistent with DKA.  She was noted to have ketones in her urine.  She was started on intravenous fluids and intravenous insulin.  Hemoglobin A1c is in process.  Now that her vomiting is better, she may be will transition back to her glipizide.  We will give 1 dose of Lantus today to help transition off the insulin infusion.  Restart on glipizide.  Advance diet and monitor blood sugars  [employer]

## 2018-04-17 NOTE — Progress Notes (Addendum)
Inpatient Diabetes Program Recommendations  AACE/ADA: New Consensus Statement on Inpatient Glycemic Control (2019)  Target Ranges:  Prepandial:   less than 140 mg/dL      Peak postprandial:   less than 180 mg/dL (1-2 hours)      Critically ill patients:  140 - 180 mg/dL   Results for Ann Richards, Ann Richards (MRN [id number]) as of 04/17/2018 08:33  Ref. Range 04/17/2018 00:38 04/17/2018 01:45  Glucose-Capillary Latest Ref Range: 70 - 99 mg/dL 409 (H) 811 (H)   Results for Ann Richards, Ann Richards (MRN [id number]) as of 04/17/2018 08:33  Ref. Range 04/16/2018 20:16  Glucose Latest Ref Range: 70 - 99 mg/dL 213 (H)   Results for Ann Richards, Ann Richards (MRN [id number]) as of 04/17/2018 08:33  Ref. Range 08/24/2016 10:30 11/23/2016 10:51 10/17/2017 09:07  Hemoglobin A1C Unknown 12.2 (H) 10.3 9.4   Review of Glycemic Control  Diabetes history: DM2 (dx in March 2018) Outpatient Diabetes medications: Glipizide 5 mg BID Current orders for Inpatient glycemic control: IV insulin drip per DKA protocol  Inpatient Diabetes Program Recommendations:  Insulin - IV drip/GlucoStabilizer: IV insulin should be continued until acidosis is resolved as determined by MD (CO2 >20, AG 10-12). HbgA1C: Please consider ordering an A1C to evaluate glycemic control over the past 2-3 months. Labs: Please consider ordering C-peptide.   NOTE: In reviewing the chart, noted patient was dx with DM in March 2018 during prior hospitalization and was discharged on Glipizide 2.5 mg BID and Metformin 1000 mg BID on 08/26/16. Per current home medication list patient is taking Glipizide 5 mg BID.  Noted initial glucose 368 mg/dl and patient presented in DKA and was started on IV insulin drip.  Addendum 04/17/18@14 :47-Spoke with patient over the phone regarding DM control. Patient states that she is seeing Dr. Gerda Diss every 6 months or when needed and she has been taking Glipizide 5 mg BID for DM control. Patient reports that she got  sick on Friday and she last took Glipizide on Thursday. Patient does not currently have insurance so she does not follow up with PCP every 3 months due to having to pay for it out of pocket. Patient is using Reli-On glucometer and she is checking her glucose once every other day and her glucose is usually 130-200 mg/dl recently. Inquired about recent A1C and patient states that last A1C was 9.4% in April when she last saw PCP. Informed patient that current A1C is ordered but not resulted yet. Encouraged her to ask about results of current A1C. Explained that she came in with DKA and she has been transitioned from IV to SQ insulin. Informed patient that she did receive Lantus 20 units (one time order) and is ordered Novolog correction and Glipizide. Explained that glucose trends and A1C will be followed to determine if she needs additional DM medications for DM control. Discussed community resources and encouraged patient to check into resources to see if she could qualify for any assistance (also discussed [facility] since she does work to see if she can get care there). Patient verbalized understanding of information and states that she has no questions at this time related to DM.  Thanks, Orlando Penner, RN, MSN, CDE Diabetes Coordinator Inpatient Diabetes Program [phone] (Team Pager from 8am to 5pm)

## 2018-04-18 ENCOUNTER — Ambulatory Visit: Payer: Self-pay | Admitting: Family Medicine

## 2018-04-18 DIAGNOSIS — F329 Major depressive disorder, single episode, unspecified: Secondary | ICD-10-CM

## 2018-04-18 DIAGNOSIS — K529 Noninfective gastroenteritis and colitis, unspecified: Secondary | ICD-10-CM | POA: Diagnosis present

## 2018-04-18 DIAGNOSIS — E1310 Other specified diabetes mellitus with ketoacidosis without coma: Secondary | ICD-10-CM

## 2018-04-18 LAB — GLUCOSE, CAPILLARY
GLUCOSE-CAPILLARY: 273 mg/dL — AB (ref 70–99)
GLUCOSE-CAPILLARY: 190 mg/dL — AB (ref 70–99)
GLUCOSE-CAPILLARY: 183 mg/dL — AB (ref 70–99)
Glucose-Capillary: 184 mg/dL — ABNORMAL HIGH (ref 70–99)

## 2018-04-18 LAB — BASIC METABOLIC PANEL
ANION GAP: 8 (ref 5–15)
BUN: 9 mg/dL (ref 6–20)
CALCIUM: 8.1 mg/dL — AB (ref 8.9–10.3)
CO2: 22 mmol/L (ref 22–32)
Chloride: 104 mmol/L (ref 98–111)
Creatinine, Ser: 0.6 mg/dL (ref 0.44–1.00)
GFR calc Af Amer: >60 mL/min (ref >60)
GLUCOSE: 275 mg/dL — AB (ref 70–99)
POTASSIUM: 3.8 mmol/L (ref 3.5–5.1)
Sodium: 134 mmol/L — ABNORMAL LOW (ref 135–145)

## 2018-04-18 LAB — HIV ANTIBODY (ROUTINE TESTING W REFLEX): HIV Screen 4th Generation wRfx: NONREACTIVE

## 2018-04-18 LAB — MAGNESIUM: MAGNESIUM: 2.1 mg/dL (ref 1.7–2.4)

## 2018-04-18 MED ORDER — POTASSIUM CHLORIDE 10 MEQ/100ML IV SOLN
10.0000 meq | INTRAVENOUS | Status: AC
  Administered 2018-04-18 (×4): 10 meq via INTRAVENOUS
  Filled 2018-04-18 (×4): qty 100

## 2018-04-18 MED ORDER — LIVING WELL WITH DIABETES BOOK
Freq: Once | Status: AC
  Administered 2018-04-18
  Filled 2018-04-18: qty 1

## 2018-04-18 MED ORDER — SODIUM CHLORIDE 0.45 % IV BOLUS
1000.0000 mL | INTRAVENOUS | Status: AC
  Administered 2018-04-18 (×2): 1000 mL via INTRAVENOUS

## 2018-04-18 MED ORDER — DICYCLOMINE HCL 10 MG PO CAPS
10.0000 mg | ORAL_CAPSULE | Freq: Three times a day (TID) | ORAL | Status: DC
  Administered 2018-04-18 – 2018-04-19 (×2): 10 mg via ORAL
  Filled 2018-04-18 (×2): qty 1

## 2018-04-18 MED ORDER — INSULIN ASPART PROT & ASPART (70-30 MIX) 100 UNIT/ML ~~LOC~~ SUSP
10.0000 [IU] | Freq: Two times a day (BID) | SUBCUTANEOUS | Status: DC
  Administered 2018-04-18 – 2018-04-19 (×2): 10 [IU] via SUBCUTANEOUS
  Filled 2018-04-18: qty 10

## 2018-04-18 MED ORDER — METOCLOPRAMIDE HCL 5 MG/ML IJ SOLN
5.0000 mg | Freq: Four times a day (QID) | INTRAMUSCULAR | Status: DC
  Administered 2018-04-18 – 2018-04-19 (×4): 5 mg via INTRAVENOUS
  Filled 2018-04-18 (×4): qty 2

## 2018-04-18 NOTE — Progress Notes (Signed)
Inpatient Diabetes Program Recommendations  AACE/ADA: New Consensus Statement on Inpatient Glycemic Control (2019)  Target Ranges:  Prepandial:   less than 140 mg/dL      Peak postprandial:   less than 180 mg/dL (1-2 hours)      Critically ill patients:  140 - 180 mg/dL  Results for YUKARI, FLAX (MRN [id number]) as of 04/18/2018 07:16  Ref. Range 04/18/2018 04:01  Glucose Latest Ref Range: 70 - 99 mg/dL 409 (H)   Results for BEA, DUREN (MRN [id number]) as of 04/18/2018 07:16  Ref. Range 04/17/2018 08:46 04/17/2018 09:31 04/17/2018 10:37 04/17/2018 11:47 04/17/2018 13:35 04/17/2018 16:27 04/17/2018 21:44  Glucose-Capillary Latest Ref Range: 70 - 99 mg/dL 956 (H) 213 (H) 086 (H) 154 (H)  Novolog 4 units @ 12:09 185 (H)  Lantus 20 units @ 13:05 175 (H)  Novolog 4 units  Glipizide 5 mg 180 (H)   Results for MAKINZIE, CONSIDINE (MRN [id number]) as of 04/18/2018 07:16  Ref. Range 08/24/2016 10:30 11/23/2016 10:51 10/17/2017 09:07 04/17/2018 04:18  Hemoglobin A1C Latest Ref Range: 4.8 - 5.6 % 12.2 (H) 10.3 9.4 10.9 (H)   Review of Glycemic Control  DM2 (dx in March 2018) Outpatient Diabetes medications: Glipizide 5 mg BID Current orders for Inpatient glycemic control: Novolog 0-20 units TID with meals, Novolog 0-5 units QHS, Glipizide 5 mg BID  Inpatient Diabetes Program Recommendations:  HgbA1C: A1C 10.9% on 04/16/18 indicating an average glucose of 266 mg/dl over the past 2-3 months. Patient will need additional DM medications as an outpatient to improve glycemic control and encouraged to follow up with PCP. Patient has no insurance; therefore, she will need affordable medications.  Thanks, Orlando Penner, RN, MSN, CDE Diabetes Coordinator Inpatient Diabetes Program [phone] (Team Pager from 8am to 5pm)

## 2018-04-18 NOTE — Progress Notes (Signed)
PROGRESS NOTE    Ann Richards  UEA:[id number] DOB: [birth year] DOA: 04/16/2018 PCP: [name], MD    Brief Narrative:  27 year old female with a history of type 2 diabetes, presents to the hospital with vomiting, diarrhea, abdominal pain.  CT scan showed enteritis.  She was also noted to be in diabetic ketoacidosis.  Started on IV fluid hydration as well as insulin infusion.  Her DKA has resolved and she is in transition\cutaneous insulin.  A1c 10.9.  She will be discharged on her home dose of glipizide as well as additional subcutaneous insulin.  She is still having significant nausea and frequent stools with enteritis.  Continue to monitor.  If GI symptoms improve, anticipate discharge in the next 24 hours.   Assessment & Plan:   Principal Problem:   DKA (diabetic ketoacidoses) (HCC) Active Problems:   Generalized anxiety disorder   Depression   1. Diabetes type 2, uncontrolled with diabetic ketoacidosis.  Admitted with DKA and started on IV fluids and intravenous insulin.  Anion gap is closed and blood sugars have improved.  She is been transitioned to subcutaneous insulin.  She was initially tried back on just her glipizide, but she has become hypoglycemic again.  A1c is noted to be 10.9.  She is been started on 70/30 insulin.  She is agreeable to continue this on discharge.  Continue to follow blood sugars. 2. Gastroenteritis.  Overall nausea and vomiting have improved, but have recurred today.  Provide Reglan for nausea as well as Bentyl for abdominal cramping.  Continue diet as tolerated. 3. Sinus tachycardia.  Possibly related to volume depletion and DKA where she needs more hydration, could also be related to pain.  Continue IV fluids and current management and monitor. 4. Anxiety/depression.  Continue Lexapro and Xanax as needed.   DVT prophylaxis: Lovenox Code Status: Full code Family Communication: Discussed with mother at the bedside Disposition Plan:  Discharge home once improved   Consultants:     Procedures:     Antimicrobials:       Subjective: Patient reports she is tolerated her p.o. intake yesterday.  This morning she is developed recurrent nausea and vomiting.  She is also had a recurrent frequent stools.  She denies that there are liquid stools and says they are formed.  She does have significant abdominal pain prior to passing gas and moving her bowels which resolves once her bowels have moved.  Objective: Vitals:   04/18/18 0801 04/18/18 0900 04/18/18 1000 04/18/18 1218  BP:  (!) 130/91 (!) 135/96   Pulse:  (!) 113 (!) 110   Resp:  (!) 22 20   Temp: 98.6 F (37 C)   98 F (36.7 C)  TempSrc: Oral   Oral  SpO2:  98% 95%   Weight:      Height:        Intake/Output Summary (Last 24 hours) at 04/18/2018 1616 Last data filed at 04/18/2018 1506 Gross per 24 hour  Intake 6847.18 ml  Output 3100 ml  Net 3747.18 ml   Filed Weights   04/16/18 1919 04/17/18 0228 04/18/18 0400  Weight: 68 kg 71.9 kg 74.6 kg    Examination:  General exam: Appears calm and comfortable  Respiratory system: Clear to auscultation. Respiratory effort normal. Cardiovascular system: S1 & S2 heard, mildly tachycardic. No JVD, murmurs, rubs, gallops or clicks. No pedal edema. Gastrointestinal system: Abdomen is nondistended, soft and mild tenderness left lower quadrant. No organomegaly or masses felt. Normal bowel sounds heard.  Central nervous system: Alert and oriented. No focal neurological deficits. Extremities: Symmetric 5 x 5 power. Skin: No rashes, lesions or ulcers Psychiatry: Judgement and insight appear normal. Mood & affect appropriate.     Data Reviewed: I have personally reviewed following labs and imaging studies  CBC: Recent Labs  Lab 04/16/18 2016  WBC 22.7*  HGB 13.8  HCT 44.3  MCV 81.9  PLT 294   Basic Metabolic Panel: Recent Labs  Lab 04/17/18 0418 04/17/18 0903 04/17/18 1300 04/17/18 1628  04/18/18 0401  NA 131* 133* 131* 132* 134*  K 3.4* 3.1* 3.0* 3.6 3.8  CL 104 105 102 101 104  CO2 17* 19* 19* 20* 22  GLUCOSE 241* 137* 215* 200* 275*  BUN 10 8 8 8 9   CREATININE 0.53 0.43* 0.59 0.59 0.60  CALCIUM 7.7* 7.8* 7.9* 8.2* 8.1*  MG  --  1.9  --   --   --    GFR: Estimated Creatinine Clearance: 96.7 mL/min (by C-G formula based on SCr of 0.6 mg/dL). Liver Function Tests: Recent Labs  Lab 04/16/18 2016  AST 14*  ALT 20  ALKPHOS 115  BILITOT 1.3*  PROT 8.9*  ALBUMIN 3.4*   Recent Labs  Lab 04/16/18 2016  LIPASE 15   No results for input(s): AMMONIA in the last 168 hours. Coagulation Profile: No results for input(s): INR, PROTIME in the last 168 hours. Cardiac Enzymes: No results for input(s): CKTOTAL, CKMB, CKMBINDEX, TROPONINI in the last 168 hours. BNP (last 3 results) No results for input(s): PROBNP in the last 8760 hours. HbA1C: Recent Labs    04/17/18 0418  HGBA1C 10.9*   CBG: Recent Labs  Lab 04/17/18 1335 04/17/18 1627 04/17/18 2144 04/18/18 0800 04/18/18 1215  GLUCAP 185* 175* 180* 273* 184*   Lipid Profile: No results for input(s): CHOL, HDL, LDLCALC, TRIG, CHOLHDL, LDLDIRECT in the last 72 hours. Thyroid Function Tests: No results for input(s): TSH, T4TOTAL, FREET4, T3FREE, THYROIDAB in the last 72 hours. Anemia Panel: No results for input(s): VITAMINB12, FOLATE, FERRITIN, TIBC, IRON, RETICCTPCT in the last 72 hours. Sepsis Labs: No results for input(s): PROCALCITON, LATICACIDVEN in the last 168 hours.  Recent Results (from the past 240 hour(s))  MRSA PCR Screening     Status: None   Collection Time: 04/17/18  2:26 AM  Result Value Ref Range Status   MRSA by PCR NEGATIVE NEGATIVE Final    Comment:        The GeneXpert MRSA Assay (FDA approved for NASAL specimens only), is one component of a comprehensive MRSA colonization surveillance program. It is not intended to diagnose MRSA infection nor to guide or monitor treatment  for MRSA infections. Performed at [facility], [address] [place], [place] [address]          Radiology Studies: Dg Chest 2 View  Result Date: 04/17/2018 CLINICAL DATA:  27 year old female with leukocytosis. EXAM: CHEST - 2 VIEW COMPARISON:  None. FINDINGS: The heart size and mediastinal contours are within normal limits. Both lungs are clear. The visualized skeletal structures are unremarkable. IMPRESSION: No active cardiopulmonary disease. Electronically Signed   By: Elgie Collard M.D.   On: 04/17/2018 02:22   Ct Abdomen Pelvis W Contrast  Result Date: 04/17/2018 CLINICAL DATA:  Mid to upper and lower abdominal pain 3 days with nausea, vomiting and diarrhea. EXAM: CT ABDOMEN AND PELVIS WITH CONTRAST TECHNIQUE: Multidetector CT imaging of the abdomen and pelvis was performed using the standard protocol following bolus administration of intravenous  contrast. CONTRAST:  75mL ISOVUE-300 IOPAMIDOL (ISOVUE-300) INJECTION 61% COMPARISON:  08/25/2016 and 09/11/2010 FINDINGS: Lower chest: Minimal linear atelectasis right base. Hepatobiliary: Liver, gallbladder and biliary tree are normal. Pancreas: Normal. Spleen: Normal Adrenals/Urinary Tract: Adrenal glands are normal. Kidneys normal in size with mild focal renal cortical scarring over the upper pole left kidney with an associated 6 mm calcification unchanged. Ureters and bladder are normal. Stomach/Bowel: Stomach is normal. There are several thick-walled small bowel loops in the left upper quadrant. There are a few thick-walled small bowel loops over the lower abdomen/pelvis. There is patchy mesenteric fluid. No dilated small bowel loops. Previous appendectomy. Air and stool over the right colon and transverse colon as the descending and rectosigmoid colon are decompressed. Vascular/Lymphatic: Normal. Reproductive: Uterus and ovaries are normal. Other: None. Musculoskeletal: Within normal. IMPRESSION: Several thick-walled small bowel loops  over the left upper quadrant and lower abdomen/pelvis. Associated patchy mesenteric fluid/ascites. No free peritoneal air. Findings are likely due to regional enteritis of inflammatory or infectious nature. 6 mm nonobstructing left renal stone with mild left renal cortical scarring unchanged. Electronically Signed   By: Elberta Fortis M.D.   On: 04/17/2018 00:18        Scheduled Meds: . dicyclomine  10 mg Oral TID AC  . enoxaparin (LOVENOX) injection  40 mg Subcutaneous Q24H  . escitalopram  10 mg Oral Daily  . glipiZIDE  5 mg Oral BID AC  . insulin aspart  0-20 Units Subcutaneous TID WC  . insulin aspart  0-5 Units Subcutaneous QHS  . insulin aspart protamine- aspart  10 Units Subcutaneous BID WC  . metoCLOPramide (REGLAN) injection  5 mg Intravenous Q6H   Continuous Infusions: . lactated ringers 125 mL/hr at 04/18/18 1505     LOS: 1 day    Time spent:    Erick Blinks, MD [facility] Pager [phone]  If 7PM-7AM, please contact night-coverage [email or web address] Password [email or web address] 04/18/2018, 4:16 PM

## 2018-04-18 NOTE — Care Management Note (Signed)
Case Management Note  Patient Details  Name: Ann Richards MRN: [id number] Date of Birth: [birth year]  Subjective/Objective:   DKA. From home with mother. Works PT. Does not have insurance. Has PCP- Dr. Gerda Diss. Will DC home on insulin. Discussed with patient and mother. They are aware they will be prescribed the cheapest brand available. Patient plans to f/u with Dr. Gerda Diss.                  Action/Plan: DC home.   Expected Discharge Date:    04/18/2018              Expected Discharge Plan:  Home/Self Care  In-House Referral:     Discharge planning Services  CM Consult, Medication Assistance  Post Acute Care Choice:  NA Choice offered to:  NA  DME Arranged:    DME Agency:     HH Arranged:    HH Agency:     Status of Service:  Completed, signed off  If discussed at [employer] of Stay Meetings, dates discussed:    Additional Comments:  Danesha Kirchoff, Chrystine Oiler, RN 04/18/2018, 11:30 AM

## 2018-04-19 DIAGNOSIS — R109 Unspecified abdominal pain: Secondary | ICD-10-CM

## 2018-04-19 DIAGNOSIS — K529 Noninfective gastroenteritis and colitis, unspecified: Secondary | ICD-10-CM

## 2018-04-19 DIAGNOSIS — E1110 Type 2 diabetes mellitus with ketoacidosis without coma: Principal | ICD-10-CM

## 2018-04-19 DIAGNOSIS — D72829 Elevated white blood cell count, unspecified: Secondary | ICD-10-CM

## 2018-04-19 LAB — CBC
HCT: 36.7 % (ref 36.0–46.0)
HEMOGLOBIN: 11.6 g/dL — AB (ref 12.0–15.0)
MCH: 26.2 pg (ref 26.0–34.0)
MCHC: 31.6 g/dL (ref 30.0–36.0)
MCV: 82.8 fL (ref 80.0–100.0)
PLATELETS: 301 10*3/uL (ref 150–400)
RBC: 4.43 MIL/uL (ref 3.87–5.11)
RDW: 13.9 % (ref 11.5–15.5)
WBC: 16.5 10*3/uL — AB (ref 4.0–10.5)
nRBC: 0 % (ref 0.0–0.2)

## 2018-04-19 LAB — BASIC METABOLIC PANEL
ANION GAP: 8 (ref 5–15)
BUN: <5 mg/dL — ABNORMAL LOW (ref 6–20)
CALCIUM: 8.2 mg/dL — AB (ref 8.9–10.3)
CHLORIDE: 102 mmol/L (ref 98–111)
CO2: 26 mmol/L (ref 22–32)
CREATININE: 0.42 mg/dL — AB (ref 0.44–1.00)
GFR calc Af Amer: >60 mL/min (ref >60)
Glucose, Bld: 229 mg/dL — ABNORMAL HIGH (ref 70–99)
POTASSIUM: 3.6 mmol/L (ref 3.5–5.1)
Sodium: 136 mmol/L (ref 135–145)

## 2018-04-19 LAB — GLUCOSE, CAPILLARY
GLUCOSE-CAPILLARY: 196 mg/dL — AB (ref 70–99)
Glucose-Capillary: 166 mg/dL — ABNORMAL HIGH (ref 70–99)

## 2018-04-19 MED ORDER — TRAMADOL HCL 50 MG PO TABS: 50.0000 mg | ORAL_TABLET | Freq: Four times a day (QID) | ORAL | 0 refills | Status: DC | PRN

## 2018-04-19 MED ORDER — DICYCLOMINE HCL 10 MG PO CAPS: 10.0000 mg | ORAL_CAPSULE | Freq: Three times a day (TID) | ORAL | 0 refills | Status: DC

## 2018-04-19 MED ORDER — METOCLOPRAMIDE HCL 5 MG PO TABS: 5.0000 mg | ORAL_TABLET | Freq: Three times a day (TID) | ORAL | 0 refills | Status: DC

## 2018-04-19 MED ORDER — INSULIN STARTER KIT- SYRINGES (ENGLISH)
1.0000 | Freq: Once | Status: AC
  Administered 2018-04-19: 1
  Filled 2018-04-19: qty 1

## 2018-04-19 MED ORDER — INSULIN ASPART PROT & ASPART (70-30 MIX) 100 UNIT/ML ~~LOC~~ SUSP
15.0000 [IU] | Freq: Two times a day (BID) | SUBCUTANEOUS | Status: DC
  Filled 2018-04-19: qty 10

## 2018-04-19 MED ORDER — INSULIN NPH ISOPHANE & REGULAR (70-30) 100 UNIT/ML ~~LOC~~ SUSP: 15.0000 [IU] | Freq: Two times a day (BID) | SUBCUTANEOUS | 3 refills | Status: DC

## 2018-04-19 MED ORDER — GLIPIZIDE 5 MG PO TABS: 5.0000 mg | ORAL_TABLET | Freq: Two times a day (BID) | ORAL | 0 refills | Status: DC

## 2018-04-19 NOTE — Progress Notes (Signed)
Pt was discharged from facility with no complaints of pain and stable vital signs. Diabetes starter kit was given to patient and gone over. This morning pt demonstrated proper ways to check blood sugar and how to administer insulin injections. Discharge instructions was given to patient and she verbalized understanding.   Jeris Penta, RN

## 2018-04-19 NOTE — Discharge Summary (Addendum)
Physician Discharge Summary  Ann Richards:[id number] DOB: 03/17/1991 DOA: 04/16/2018  PCP: [name], MD  Admit date: 04/16/2018 Discharge date: 04/19/2018  Admitted From: Home Disposition: Home  Recommendations for Outpatient Follow-up:  1. Follow up with PCP in 1-2 weeks 2. Please obtain BMP/CBC in one week  Discharge Condition: Stable CODE STATUS: Full code Diet recommendation: Carb modified  Brief/Interim Summary: 27 year old female with a history of type 2 diabetes, presents to the hospital with complaints of vomiting, diarrhea and abdominal pain.  CT scan of the abdomen and pelvis showed focal enteritis.  She was also noted to be diabetic ketoacidosis.  Patient was started on IV fluid hydration as well as intravenous insulin infusion.  Her DKA had resolved and she was transitioned to subcutaneous insulin.  A1c was checked and noted to be 10.9.  Plan will be to discharge patient home on glipizide as well as 70/30 insulin.  This will need to be further titrated as an outpatient.  Blood sugars have been relatively stable, but he will need further adjustments.  She has been educated on administering insulin.  Regarding her vomiting and diarrhea, these were treated supportively.  She received Reglan for nausea and vomiting and Bentyl for abdominal cramping.  She reports his symptoms have improved.  She is no longer having diarrhea.  Anticipate this was likely a viral enteritis.  Patient is tolerating a solid diet and feels that she can discharge home.  Discharge Diagnoses:  Principal Problem:   DKA (diabetic ketoacidoses) (HCC) Active Problems:   Generalized anxiety disorder   Depression   Gastroenteritis   DM type 2 with DKA   Discharge Instructions  Discharge Instructions    Diet - low sodium heart healthy   Complete by:  As directed    Increase activity slowly   Complete by:  As directed      Allergies as of 04/19/2018      Reactions   Metformin  And Related    Sensitivity to med; stomach issues      Medication List    TAKE these medications   acetaminophen 500 MG tablet Commonly known as:  TYLENOL Take 500 mg by mouth every 6 (six) hours as needed for mild pain or moderate pain.   albuterol 108 (90 Base) MCG/ACT inhaler Commonly known as:  PROVENTIL HFA;VENTOLIN HFA INHALE TWO PUFFS INTO THE LUNGS EVERY 4 HOURS AS NEEDED What changed:    how much to take  how to take this  when to take this  reasons to take this  additional instructions   ALPRAZolam 0.5 MG tablet Commonly known as:  XANAX TAKE ONE TABLET BY MOUTH ONCE DAILY AS NEEDED FOR ANXIETY/INSOMNIA. USE SPARINGLY What changed:  See the new instructions.   bismuth subsalicylate 262 MG/15ML suspension Commonly known as:  PEPTO BISMOL Take 30 mLs by mouth every 6 (six) hours as needed for indigestion or diarrhea or loose stools.   dicyclomine 10 MG capsule Commonly known as:  BENTYL Take 1 capsule (10 mg total) by mouth 3 (three) times daily before meals.   escitalopram 10 MG tablet Commonly known as:  LEXAPRO Take 1 tablet (10 mg total) by mouth daily.   glipiZIDE 5 MG tablet Commonly known as:  GLUCOTROL Take 1 tablet (5 mg total) by mouth 2 (two) times daily before a meal.   hydroxypropyl methylcellulose / hypromellose 2.5 % ophthalmic solution Commonly known as:  ISOPTO TEARS / GONIOVISC Place 1 drop into both eyes 3 (three) times  daily as needed for dry eyes.   ibuprofen 200 MG tablet Commonly known as:  ADVIL,MOTRIN Take 600-800 mg by mouth every 6 (six) hours as needed for moderate pain.   IMPLANON [place] Inject into the skin once.   insulin NPH-regular Human (70-30) 100 UNIT/ML injection Commonly known as:  NOVOLIN 70/30 Inject 15 Units into the skin 2 (two) times daily with a meal.   metoCLOPramide 5 MG tablet Commonly known as:  REGLAN Take 1 tablet (5 mg total) by mouth 3 (three) times daily.   multivitamin with minerals Tabs  tablet Take 1 tablet by mouth daily.   SB ANTI-NAUSEA PO Take 1 capsule by mouth once as needed ([employer] BRAND-OTC).   traMADol 50 MG tablet Commonly known as:  ULTRAM Take 1 tablet (50 mg total) by mouth every 6 (six) hours as needed for moderate pain (if tylenol not effective).       Allergies  Allergen Reactions  . Metformin And Related     Sensitivity to med; stomach issues    Consultations:     Procedures/Studies: Dg Chest 2 View  Result Date: 04/17/2018 CLINICAL DATA:  26 year old female with leukocytosis. EXAM: CHEST - 2 VIEW COMPARISON:  None. FINDINGS: The heart size and mediastinal contours are within normal limits. Both lungs are clear. The visualized skeletal structures are unremarkable. IMPRESSION: No active cardiopulmonary disease. Electronically Signed   By: Elgie Collard M.D.   On: 04/17/2018 02:22   Ct Abdomen Pelvis W Contrast  Result Date: 04/17/2018 CLINICAL DATA:  Mid to upper and lower abdominal pain 3 days with nausea, vomiting and diarrhea. EXAM: CT ABDOMEN AND PELVIS WITH CONTRAST TECHNIQUE: Multidetector CT imaging of the abdomen and pelvis was performed using the standard protocol following bolus administration of intravenous contrast. CONTRAST:  75mL ISOVUE-300 IOPAMIDOL (ISOVUE-300) INJECTION 61% COMPARISON:  08/25/2016 and 09/11/2010 FINDINGS: Lower chest: Minimal linear atelectasis right base. Hepatobiliary: Liver, gallbladder and biliary tree are normal. Pancreas: Normal. Spleen: Normal Adrenals/Urinary Tract: Adrenal glands are normal. Kidneys normal in size with mild focal renal cortical scarring over the upper pole left kidney with an associated 6 mm calcification unchanged. Ureters and bladder are normal. Stomach/Bowel: Stomach is normal. There are several thick-walled small bowel loops in the left upper quadrant. There are a few thick-walled small bowel loops over the lower abdomen/pelvis. There is patchy mesenteric fluid. No dilated small  bowel loops. Previous appendectomy. Air and stool over the right colon and transverse colon as the descending and rectosigmoid colon are decompressed. Vascular/Lymphatic: Normal. Reproductive: Uterus and ovaries are normal. Other: None. Musculoskeletal: Within normal. IMPRESSION: Several thick-walled small bowel loops over the left upper quadrant and lower abdomen/pelvis. Associated patchy mesenteric fluid/ascites. No free peritoneal air. Findings are likely due to regional enteritis of inflammatory or infectious nature. 6 mm nonobstructing left renal stone with mild left renal cortical scarring unchanged. Electronically Signed   By: Elberta Fortis M.D.   On: 04/17/2018 00:18       Subjective:  Feeling better today.  No further nausea or vomiting.  Last bowel movement was yesterday.  Abdominal cramping is better.  Discharge Exam: Vitals:   04/18/18 1652 04/18/18 2112 04/19/18 0536 04/19/18 0900  BP:  (!) 143/93 (!) 142/99 (!) 144/98  Pulse:  (!) 111 (!) 112 (!) 109  Resp:  20 20 20   Temp: 97.9 F (36.6 C) 98.7 F (37.1 C) 98 F (36.7 C) (!) 97.2 F (36.2 C)  TempSrc: Oral Oral Oral Oral  SpO2:  100% 95%  100%  Weight:      Height:        General: Pt is alert, awake, not in acute distress Cardiovascular: RRR, S1/S2 +, no rubs, no gallops Respiratory: CTA bilaterally, no wheezing, no rhonchi Abdominal: Soft, NT, ND, bowel sounds + Extremities: no edema, no cyanosis    The results of significant diagnostics from this hospitalization (including imaging, microbiology, ancillary and laboratory) are listed below for reference.     Microbiology: Recent Results (from the past 240 hour(s))  MRSA PCR Screening     Status: None   Collection Time: 04/17/18  2:26 AM  Result Value Ref Range Status   MRSA by PCR NEGATIVE NEGATIVE Final    Comment:        The GeneXpert MRSA Assay (FDA approved for NASAL specimens only), is one component of a comprehensive MRSA colonization surveillance  program. It is not intended to diagnose MRSA infection nor to guide or monitor treatment for MRSA infections. Performed at [facility], [address], [place], [place] [address]      Labs: BNP (last 3 results) No results for input(s): BNP in the last 8760 hours. Basic Metabolic Panel: Recent Labs  Lab 04/17/18 0903 04/17/18 1300 04/17/18 1628 04/18/18 0401 04/18/18 1636 04/19/18 0350  NA 133* 131* 132* 134*  --  136  K 3.1* 3.0* 3.6 3.8  --  3.6  CL 105 102 101 104  --  102  CO2 19* 19* 20* 22  --  26  GLUCOSE 137* 215* 200* 275*  --  229*  BUN 8 8 8 9   --  <5*  CREATININE 0.43* 0.59 0.59 0.60  --  0.42*  CALCIUM 7.8* 7.9* 8.2* 8.1*  --  8.2*  MG 1.9  --   --   --  2.1  --    Liver Function Tests: Recent Labs  Lab 04/16/18 2016  AST 14*  ALT 20  ALKPHOS 115  BILITOT 1.3*  PROT 8.9*  ALBUMIN 3.4*   Recent Labs  Lab 04/16/18 2016  LIPASE 15   No results for input(s): AMMONIA in the last 168 hours. CBC: Recent Labs  Lab 04/16/18 2016 04/19/18 0350  WBC 22.7* 16.5*  HGB 13.8 11.6*  HCT 44.3 36.7  MCV 81.9 82.8  PLT 294 301   Cardiac Enzymes: No results for input(s): CKTOTAL, CKMB, CKMBINDEX, TROPONINI in the last 168 hours. BNP: Invalid input(s): POCBNP CBG: Recent Labs  Lab 04/17/18 2144 04/18/18 0800 04/18/18 1215 04/18/18 1650 04/18/18 2101  GLUCAP 180* 273* 184* 190* 183*   D-Dimer No results for input(s): DDIMER in the last 72 hours. Hgb A1c Recent Labs    04/17/18 0418  HGBA1C 10.9*   Lipid Profile No results for input(s): CHOL, HDL, LDLCALC, TRIG, CHOLHDL, LDLDIRECT in the last 72 hours. Thyroid function studies No results for input(s): TSH, T4TOTAL, T3FREE, THYROIDAB in the last 72 hours.  Invalid input(s): FREET3 Anemia work up No results for input(s): VITAMINB12, FOLATE, FERRITIN, TIBC, IRON, RETICCTPCT in the last 72 hours. Urinalysis    Component Value Date/Time   COLORURINE YELLOW 04/16/2018 2215   APPEARANCEUR  CLEAR 04/16/2018 2215   LABSPEC 1.027 04/16/2018 2215   PHURINE 5.0 04/16/2018 2215   GLUCOSEU >=500 (A) 04/16/2018 2215   HGBUR NEGATIVE 04/16/2018 2215   BILIRUBINUR NEGATIVE 04/16/2018 2215   KETONESUR 80 (A) 04/16/2018 2215   PROTEINUR 100 (A) 04/16/2018 2215   UROBILINOGEN 0.2 09/10/2010 2351   NITRITE NEGATIVE 04/16/2018 2215   LEUKOCYTESUR NEGATIVE  04/16/2018 2215   Sepsis Labs Invalid input(s): PROCALCITONIN,  WBC,  LACTICIDVEN Microbiology Recent Results (from the past 240 hour(s))  MRSA PCR Screening     Status: None   Collection Time: 04/17/18  2:26 AM  Result Value Ref Range Status   MRSA by PCR NEGATIVE NEGATIVE Final    Comment:        The GeneXpert MRSA Assay (FDA approved for NASAL specimens only), is one component of a comprehensive MRSA colonization surveillance program. It is not intended to diagnose MRSA infection nor to guide or monitor treatment for MRSA infections. Performed at [facility], [address] [place], [place] [address]      Time coordinating discharge:  SIGNED:   Erick Blinks, MD  [facility] 04/19/2018, 10:09 AM Pager   If 7PM-7AM, please contact night-coverage [email or web address] Password [email or web address]

## 2018-04-19 NOTE — Progress Notes (Addendum)
Inpatient Diabetes Program Recommendations  AACE/ADA: New Consensus Statement on Inpatient Glycemic Control (2019)  Target Ranges:  Prepandial:   less than 140 mg/dL      Peak postprandial:   less than 180 mg/dL (1-2 hours)      Critically ill patients:  140 - 180 mg/dL  Results for RAIYA, STAINBACK (MRN [id number]) as of 04/19/2018 07:48  Ref. Range 04/19/2018 03:50  Glucose Latest Ref Range: 70 - 99 mg/dL 229 (H)   Results for FLORIS, NEUHAUS (MRN [id number]) as of 04/19/2018 07:48  Ref. Range 04/17/2018 08:46 04/17/2018 09:31 04/17/2018 10:37 04/17/2018 11:47 04/17/2018 13:35 04/17/2018 16:27 04/17/2018 21:44 04/18/2018 08:00 04/18/2018 12:15 04/18/2018 16:50 04/18/2018 21:01  Glucose-Capillary Latest Ref Range: 70 - 99 mg/dL 138 (H) 130 (H) 146 (H) 154 (H) 185 (H) 175 (H) 180 (H) 273 (H) 184 (H) 190 (H)  70 183 (H)   Review of Glycemic Control  Diabetes history: DM2 (dx in March 2018) Outpatient Diabetes medications:Glipizide 5 mg BID Current orders for Inpatient glycemic control: 70/30 10 units BID, Glipizide 5 mg BID, Novolog 0-20 units TID with meals, Novolog 0-5 units QHS  Inpatient Diabetes Program Recommendations:  Insulin - Basal: Noted patient was started on 70/30 10 units BID on 04/18/18 and she received one dose of 70/30 on 04/18/18.  HgbA1C: A1C 10.9% on 04/16/18 indicating an average glucose of 266 mg/dl over the past 2-3 months. Noted patient will be started on 70/30 BID as an outpatient. Patient should be encouraged to go to [facility] to purchase NOVOLIN 70/30 which is $25 per vial.  NOTE: Noted MD plans to discharge patient on 70/30 insulin. Diabetes Coordinator is not on [place] today but did speak with patient over the phone on 04/17/18. Ordered insulin starter kit (vial/syringe) and patient education by RNs. Sent chat message to RN asking that patient be educated on insulin and be allowed to self-administer insulin injections while  inpatient.  Will continue to follow.  Thanks, Barnie Alderman, RN, MSN, CDE Diabetes Coordinator Inpatient Diabetes Program [phone] (Team Pager from 8am to 5pm)

## 2018-07-13 ENCOUNTER — Other Ambulatory Visit: Payer: Self-pay | Admitting: Family Medicine

## 2018-07-13 NOTE — Telephone Encounter (Signed)
May ref times one 

## 2018-07-13 NOTE — Telephone Encounter (Signed)
If I recall this pt does very poor self care, need to call pt see how things are going? Current meds? Why did not f u after October admission?

## 2018-07-13 NOTE — Telephone Encounter (Signed)
Contacted pt and pt states that things are going ok. Pt states she is just taking Glipizide 5 mg twice daily and when she went to hospital in October for DKA they put her on Novolin insulin 15 units. Pt states she is not sure why she did not do a follow up after hospital admission. Pt states she wanted to get the balance on her bill and make appt with provider. Pt transferred up front

## 2018-08-14 ENCOUNTER — Ambulatory Visit (INDEPENDENT_AMBULATORY_CARE_PROVIDER_SITE_OTHER): Payer: Self-pay | Admitting: Family Medicine

## 2018-08-14 ENCOUNTER — Encounter: Payer: Self-pay | Admitting: Family Medicine

## 2018-08-14 VITALS — BP 126/84 | Ht 65.0 in | Wt 178.0 lb

## 2018-08-14 DIAGNOSIS — E119 Type 2 diabetes mellitus without complications: Secondary | ICD-10-CM

## 2018-08-14 DIAGNOSIS — F411 Generalized anxiety disorder: Secondary | ICD-10-CM

## 2018-08-14 DIAGNOSIS — J329 Chronic sinusitis, unspecified: Secondary | ICD-10-CM

## 2018-08-14 LAB — POCT GLYCOSYLATED HEMOGLOBIN (HGB A1C): Hemoglobin A1C: 7.9 % — AB (ref 4.0–5.6)

## 2018-08-14 MED ORDER — INSULIN NPH ISOPHANE & REGULAR (70-30) 100 UNIT/ML ~~LOC~~ SUSP: 15.0000 [IU] | Freq: Two times a day (BID) | SUBCUTANEOUS | 1 refills | Status: DC

## 2018-08-14 MED ORDER — AZITHROMYCIN 250 MG PO TABS: ORAL_TABLET | ORAL | 0 refills | Status: DC

## 2018-08-14 MED ORDER — GLIPIZIDE 5 MG PO TABS: ORAL_TABLET | ORAL | 1 refills | Status: DC

## 2018-08-14 NOTE — Patient Instructions (Addendum)
PLEASE start checking your fasting sugars each morning  We will work on a referral to a diabetes specialist  Take the whole z pk  Congratulations on the exercise please keep up with it  Please use miralax one scoop daily forconstipation

## 2018-08-14 NOTE — Progress Notes (Signed)
   Subjective:    Patient ID: Ann Richards, female    DOB: [birth year], 28 y.o.   MRN: [id number]  Diabetes  She presents for her follow-up diabetic visit. She has type 2 diabetes mellitus. Risk factors for coronary artery disease include diabetes mellitus. Current diabetic treatment includes insulin injections and oral agent (monotherapy). She is compliant with treatment all of the time. Her weight is stable. She is following a diabetic diet. She has not had a previous visit with a dietitian. She does not see a podiatrist.Eye exam is not current.   Patient stated se just got over the flu and thinks she has sinus infection and still has cough- wonders if she needs Z-pack to knock it out  Results for orders placed or performed in visit on 08/14/18  POCT glycosylated hemoglobin (Hb A1C)  Result Value Ref Range   Hemoglobin A1C 7.9 (A) 4.0 - 5.6 %   HbA1c POC (<> result, manual entry)     HbA1c, POC (prediabetic range)     HbA1c, POC (controlled diabetic range)     Exercising five days per week   Also calisthentics  Of note patient was hospitalized for diabetic ketoacidosis per hospitalist, along with gastroenteritis-like picture.  Was advised to follow-up with.  We saw patient last May advised to follow-up in 49months unfortunately.  Hospitalist physicians started her on insulin and actually apparently wrote refills.  Patient claims to continue to take 15 units with breakfast and before supper  Review of Systems No headache, no major weight loss or weight gain, no chest pain no back pain abdominal pain no change in bowel habits complete ROS otherwise negative     Objective:   Physical Exam  Alert and oriented, vitals reviewed and stable, NAD ENT-TM's and ext canals WNL significant nasal congestion tenderness pharynx somewhat erythematous/otherwise normal bilat via otoscopic exam Soft palate, tonsils and post pharynx WNL via oropharyngeal exam Neck-symmetric, no masses;  thyroid nonpalpable and nontender Pulmonary-no tachypnea or accessory muscle use; Clear without wheezes via auscultation Card--no abnrml murmurs, rhythm reg and rate WNL Carotid pulses symmetric, without bruits       Assessment & Plan:  Impression 1 acute rhinosinusitis discussed will cover with antibiotics rationale discussed  2.  Type 2 diabetes.  Now on insulin.  Control improved but still suboptimal.  This patient still continues not to address her diabetes with attentive regular follow-up.  Long discussion held.  Now encouraging her to go see a diabetes doctor in order to try to light a fire under her motivation and hopefully continue to improve glycemic control.  Patient not taking her sugars at this time.  Strongly encouraged to check sugars each morning  3.  History of anxiety depression.  Patient states now feels better.  Would like to stop her Lexapro.  In fact already has labs  Referral was noted.  Medications refilled for the short-term diet exercise discussed antibiotics prescribed

## 2018-08-21 ENCOUNTER — Encounter: Payer: Self-pay | Admitting: Family Medicine

## 2018-09-11 ENCOUNTER — Ambulatory Visit: Payer: Self-pay | Admitting: "Endocrinology

## 2018-10-01 ENCOUNTER — Encounter: Payer: Self-pay | Admitting: "Endocrinology

## 2018-10-01 ENCOUNTER — Other Ambulatory Visit: Payer: Self-pay

## 2018-10-03 ENCOUNTER — Other Ambulatory Visit: Payer: Self-pay

## 2018-10-03 ENCOUNTER — Ambulatory Visit (INDEPENDENT_AMBULATORY_CARE_PROVIDER_SITE_OTHER): Payer: Self-pay | Admitting: "Endocrinology

## 2018-10-03 ENCOUNTER — Encounter: Payer: Self-pay | Admitting: "Endocrinology

## 2018-10-03 VITALS — Ht 61.0 in | Wt 165.0 lb

## 2018-10-03 DIAGNOSIS — E1165 Type 2 diabetes mellitus with hyperglycemia: Secondary | ICD-10-CM

## 2018-10-03 MED ORDER — INSULIN NPH ISOPHANE & REGULAR (70-30) 100 UNIT/ML ~~LOC~~ SUSP: 15.0000 [IU] | Freq: Two times a day (BID) | SUBCUTANEOUS | 2 refills | Status: DC

## 2018-10-03 MED ORDER — GLIPIZIDE ER 5 MG PO TB24: 5.0000 mg | ORAL_TABLET | Freq: Every day | ORAL | 3 refills | Status: DC

## 2018-10-03 NOTE — Progress Notes (Signed)
Endocrinology WebEx consult note -During COVID -19 Pandemic      10/03/2018, 4:08 PM   Subjective:    Patient ID: Ann Richards, female    DOB: 07/14/1990.  Ann Richards is being seen in consultation for management of currently uncontrolled symptomatic diabetes requested by  [name], MD.   Past Medical History:  Diagnosis Date  . Anxiety   . Depression   . Diabetes mellitus without complication (HCC)   . Insomnia     Past Surgical History:  Procedure Laterality Date  . LAPAROSCOPIC APPENDECTOMY N/A 08/25/2016   Procedure: APPENDECTOMY LAPAROSCOPIC;  Surgeon: Franky Macho, MD;  Location: [facility];  Service: General;  Laterality: N/A;    Social History   Socioeconomic History  . Marital status: Single    Spouse name: Not on file  . Number of children: Not on file  . Years of education: Not on file  . Highest education level: Not on file  Occupational History  . Not on file  Social Needs  . Financial resource strain: Not on file  . Food insecurity:    Worry: Not on file    Inability: Not on file  . Transportation needs:    Medical: Not on file    Non-medical: Not on file  Tobacco Use  . Smoking status: Former Smoker    Packs/day: 0.25    Years: 1.00    Pack years: 0.25    Types: Cigarettes    Start date: 03/14/2008    Last attempt to quit: 05/14/2009    Years since quitting: 9.3  . Smokeless tobacco: Never Used  Substance and Sexual Activity  . Alcohol use: Yes    Alcohol/week: 0.0 standard drinks    Comment: ocassional  . Drug use: No  . Sexual activity: Not Currently    Partners: Male    Birth control/protection: Condom  Lifestyle  . Physical activity:    Days per week: Not on file    Minutes per session: Not on file  . Stress: Not on file  Relationships  . Social connections:    Talks on  phone: Not on file    Gets together: Not on file    Attends religious service: Not on file    Active member of club or organization: Not on file    Attends meetings of clubs or organizations: Not on file    Relationship status: Not on file  Other Topics Concern  . Not on file  Social History Narrative  . Not on file    Family History  Problem Relation Age of Onset  . Diabetes Father     Outpatient Encounter Medications as of 10/03/2018  Medication Sig  . acetaminophen (TYLENOL) 500 MG tablet Take 500 mg by mouth every 6 (six) hours as needed for mild pain or moderate pain.  Marland Kitchen  albuterol (VENTOLIN HFA) 108 (90 Base) MCG/ACT inhaler INHALE TWO PUFFS INTO THE LUNGS EVERY 4 HOURS AS NEEDED (Patient taking differently: Inhale 2 puffs into the lungs every 4 (four) hours as needed for wheezing or shortness of breath. )  . azithromycin (ZITHROMAX Z-PAK) 250 MG tablet Take 2 tablets (500 mg) on  Day 1,  followed by 1 tablet (250 mg) once daily on Days 2 through 5.  . bismuth subsalicylate (PEPTO BISMOL) 262 MG/15ML suspension Take 30 mLs by mouth every 6 (six) hours as needed for indigestion or diarrhea or loose stools.  . Etonogestrel (IMPLANON [place]) Inject into the skin once.  . Fructose-Dextrose-Phosphor Acd (SB ANTI-NAUSEA PO) Take 1 capsule by mouth once as needed ([facility] BRAND-OTC).  Marland Kitchen glipiZIDE (GLUCOTROL XL) 5 MG 24 hr tablet Take 1 tablet (5 mg total) by mouth daily with breakfast.  . hydroxypropyl methylcellulose / hypromellose (ISOPTO TEARS / GONIOVISC) 2.5 % ophthalmic solution Place 1 drop into both eyes 3 (three) times daily as needed for dry eyes.  Marland Kitchen ibuprofen (ADVIL,MOTRIN) 200 MG tablet Take 600-800 mg by mouth every 6 (six) hours as needed for moderate pain.  Marland Kitchen insulin NPH-regular Human (NOVOLIN 70/30) (70-30) 100 UNIT/ML injection Inject 15 Units into the skin 2 (two) times daily with a meal.  . Multiple Vitamin (MULTIVITAMIN WITH MINERALS) TABS tablet Take 1 tablet by mouth  daily.  . traMADol (ULTRAM) 50 MG tablet Take 1 tablet (50 mg total) by mouth every 6 (six) hours as needed for moderate pain (if tylenol not effective).  . [DISCONTINUED] glipiZIDE (GLUCOTROL) 5 MG tablet TAKE 1 TABLET BY MOUTH TWICE DAILY BEFORE A MEAL - NEEDS OFFICE VISIT  . [DISCONTINUED] insulin NPH-regular Human (NOVOLIN 70/30) (70-30) 100 UNIT/ML injection Inject 15 Units into the skin 2 (two) times daily with a meal.   No facility-administered encounter medications on file as of 10/03/2018.     ALLERGIES: Allergies  Allergen Reactions  . Metformin And Related     Sensitivity to med; stomach issues    VACCINATION STATUS: Immunization History  Administered Date(s) Administered  . PPD Test 10/17/2017    Diabetes  She presents for her initial diabetic visit. She has type 2 diabetes mellitus. Her disease course has been improving. There are no hypoglycemic associated symptoms. There are no diabetic associated symptoms. There are no hypoglycemic complications. There are no diabetic complications. Risk factors for coronary artery disease include diabetes mellitus. Current diabetic treatment includes insulin injections and oral agent (monotherapy). Her weight is fluctuating minimally. She is following a generally unhealthy diet. When asked about meal planning, she reported none. She has not had a previous visit with a dietitian. She participates in exercise intermittently. (Patient admits that she does not monitor blood glucose.  Her recent A1c was 7.9% on August 14, 2017, improving from 10.9% on April 17, 2018.  In October she was admitted with diabetes ketoacidosis, was discharged on insulin Novolin 70/30 15 units twice daily, along with glipizide 5 mg p.o. twice daily.  She does not tolerate metformin.) An ACE inhibitor/angiotensin II receptor blocker is not being taken.     Review of Systems  Objective:    Ht  (1.549 m)   Wt 165 lb (74.8 kg)   BMI 31.18 kg/m   Wt  Readings from Last 3 Encounters:  10/03/18 165 lb (74.8 kg)  08/14/18 178 lb (80.7 kg)  04/18/18 164 lb 7.4 oz (74.6 kg)     Physical Exam    CMP (  most recent) CMP     Component Value Date/Time   NA 136 04/19/2018 0350   K 3.6 04/19/2018 0350   CL 102 04/19/2018 0350   CO2 26 04/19/2018 0350   GLUCOSE 229 (H) 04/19/2018 0350   BUN <5 (L) 04/19/2018 0350   CREATININE 0.42 (L) 04/19/2018 0350   CALCIUM 8.2 (L) 04/19/2018 0350   PROT 8.9 (H) 04/16/2018 2016   ALBUMIN 3.4 (L) 04/16/2018 2016   AST 14 (L) 04/16/2018 2016   ALT 20 04/16/2018 2016   ALKPHOS 115 04/16/2018 2016   BILITOT 1.3 (H) 04/16/2018 2016   GFRNONAA >60 04/19/2018 0350   GFRAA >60 04/19/2018 0350     Diabetic Labs (most recent): Lab Results  Component Value Date   HGBA1C 7.9 (A) 08/14/2018   HGBA1C 10.9 (H) 04/17/2018   HGBA1C 9.4 10/17/2017     Lipid Panel ( most recent) Lipid Panel  No results found for: CHOL, TRIG, HDL, CHOLHDL, VLDL, LDLCALC, LDLDIRECT    No results found for: TSH, FREET4         Assessment & Plan:   There are no diagnoses linked to this encounter.  - Ann Richards has currently uncontrolled symptomatic type 2 DM since 28 years of age,  with most recent A1c of 7.9%, improving from 10.9% in October 2019. Recent labs reviewed. - I had a long discussion with her about the progressive nature of diabetes and the pathology behind its complications. -She does not report any gross complications from her diabetes, however she remains at a high risk for more acute and chronic complications which include CAD, CVA, CKD, retinopathy, and neuropathy. These are all discussed in detail with her.  - I have counseled her on diet management by adopting a carbohydrate restricted/protein rich diet. - she admits that there is a room for improvement in her food and drink choices. - Suggestion is made for her to avoid simple carbohydrates  from her diet including Cakes, Sweet  Desserts, Ice Cream, Soda (diet and regular), Sweet Tea, Candies, Chips, Cookies, Store Bought Juices, Alcohol in Excess of  1-2 drinks a day, Artificial Sweeteners,  Coffee Creamer, and "Sugar-free" Products. This will help patient to have more stable blood glucose profile and potentially avoid unintended weight gain.  - I encouraged her to switch to  unprocessed or minimally processed complex starch and increased protein intake (animal or plant source), fruits, and vegetables.  - she is advised to stick to a routine mealtimes to eat 3 meals  a day and avoid unnecessary snacks ( to snack only to correct hypoglycemia).   - she will be scheduled with Norm Salt, RDN, CDE for individualized diabetes education.  - I have approached her with the following individualized plan to manage diabetes and patient agrees:   - she will fit from insulin treatment in order for her to achieve and maintain control of diabetes to target.   -She is advised to continue Novolin 70/30 15 units with breakfast and 15 units with supper when pre-meal blood glucose readings are above 90 mg/dL.  She is urged to continue strict monitoring of blood glucose at least 2 times a day-before breakfast and before supper.   - she is warned not to take insulin without proper monitoring per orders.  - she is encouraged to call clinic for blood glucose levels less than 70 or above 300 mg /dl. - she is advised to continue lipids at 5 mg p.o. twice daily with breakfast and supper, will  be switched to glipizide XL 5 mg p.o. daily after her current supplies.  -Not tolerate metformin causing significant GI intolerance. - she will be considered for incretin therapy as appropriate next visit.    2) Blood Pressure /Hypertension: He is not taking any antihypertensive medications for now.  3) Lipids/Hyperlipidemia: Does not have recent lipid panel, will be considered for fasting lipid panel before her next visit.  She is not on  statins.    - she is  advised to maintain close follow up with Merlyn AlbertLuking, William S, MD for primary care needs, as well as her other providers for optimal and coordinated care.  - Time spent with the patient: 45 minutes, of which >50% was spent in obtaining information about her symptoms, reviewing her previous labs/studies, evaluations, and treatments, counseling her about her currently  uncontrolled type 2 diabetes, and developing plans for long term treatment based on the latest standards of care/guidelines.  Please refer to " Patient Self Inventory" in the Media  tab for reviewed elements of pertinent patient history.  Ann Richards participated in the discussions, expressed understanding, and voiced agreement with the above plans.  All questions were answered to her satisfaction. she is encouraged to contact clinic should she have any questions or concerns prior to her return visit.  Follow up plan: - Return in about 9 weeks (around 12/05/2018) for Follow up with Pre-visit Labs, Meter, and Logs.  Marquis LunchGebre Ann Scafidi, MD [facility] [address] [place], [place] [address] Phone: [phone]  Fax: [phone]    10/03/2018, 4:08 PM  This note was partially dictated with voice recognition software. Similar sounding words can be transcribed inadequately or may not  be corrected upon review.

## 2018-10-05 ENCOUNTER — Telehealth: Payer: Self-pay

## 2018-10-05 DIAGNOSIS — E1165 Type 2 diabetes mellitus with hyperglycemia: Secondary | ICD-10-CM

## 2018-10-05 MED ORDER — INSULIN NPH ISOPHANE & REGULAR (70-30) 100 UNIT/ML ~~LOC~~ SUSP: 15.0000 [IU] | Freq: Two times a day (BID) | SUBCUTANEOUS | 2 refills | Status: DC

## 2018-10-05 MED ORDER — GLIPIZIDE ER 5 MG PO TB24: 5.0000 mg | ORAL_TABLET | Freq: Every day | ORAL | 3 refills | Status: DC

## 2018-10-05 NOTE — Telephone Encounter (Signed)
Ann Richards, CMA  

## 2018-10-08 DIAGNOSIS — F330 Major depressive disorder, recurrent, mild: Secondary | ICD-10-CM | POA: Insufficient documentation

## 2018-10-08 NOTE — Progress Notes (Signed)
Duplicate note. This encounter was created in error - please disregard. This encounter was created in error - please disregard.

## 2018-11-14 ENCOUNTER — Other Ambulatory Visit: Payer: Self-pay | Admitting: Family Medicine

## 2018-11-14 DIAGNOSIS — E1165 Type 2 diabetes mellitus with hyperglycemia: Secondary | ICD-10-CM

## 2018-11-26 ENCOUNTER — Ambulatory Visit: Payer: PRIVATE HEALTH INSURANCE | Admitting: Nutrition

## 2018-12-05 ENCOUNTER — Ambulatory Visit: Payer: Self-pay | Admitting: "Endocrinology

## 2019-01-31 ENCOUNTER — Other Ambulatory Visit: Payer: Self-pay | Admitting: "Endocrinology

## 2019-01-31 DIAGNOSIS — E1165 Type 2 diabetes mellitus with hyperglycemia: Secondary | ICD-10-CM

## 2019-02-12 ENCOUNTER — Ambulatory Visit: Payer: Self-pay | Admitting: Family Medicine

## 2019-03-11 ENCOUNTER — Telehealth: Payer: Self-pay | Admitting: "Endocrinology

## 2019-03-11 DIAGNOSIS — E1165 Type 2 diabetes mellitus with hyperglycemia: Secondary | ICD-10-CM

## 2019-03-12 ENCOUNTER — Telehealth: Payer: Self-pay

## 2019-03-12 DIAGNOSIS — E559 Vitamin D deficiency, unspecified: Secondary | ICD-10-CM

## 2019-03-12 DIAGNOSIS — R5383 Other fatigue: Secondary | ICD-10-CM

## 2019-03-12 DIAGNOSIS — E1165 Type 2 diabetes mellitus with hyperglycemia: Secondary | ICD-10-CM

## 2019-03-12 DIAGNOSIS — E1110 Type 2 diabetes mellitus with ketoacidosis without coma: Secondary | ICD-10-CM

## 2019-03-12 MED ORDER — GLIPIZIDE ER 5 MG PO TB24: 5.0000 mg | ORAL_TABLET | Freq: Every day | ORAL | 3 refills | Status: DC

## 2019-03-12 MED ORDER — NOVOLIN 70/30 RELION (70-30) 100 UNIT/ML ~~LOC~~ SUSP: SUBCUTANEOUS | 0 refills | Status: DC

## 2019-03-12 NOTE — Telephone Encounter (Signed)
Ann Richards, CMA  

## 2019-03-12 NOTE — Telephone Encounter (Signed)
Pt called and wanted to know why her medication was denied. Advised patient she needed an appt with labs prior. Patient said she was unsure of labs and did not have insurance right now. She said she is completely out of medication and needs a refill. Please advise.

## 2019-03-12 NOTE — Telephone Encounter (Signed)
Routing to Dr Nida for Advice? 

## 2019-03-12 NOTE — Telephone Encounter (Signed)
Routed to Dr Dorris Fetch for advise?

## 2019-03-12 NOTE — Telephone Encounter (Signed)
Ann Richards is calling asking why her medication has been denied and we said its because she has not been seen since October 03, 2018 by Webex new patient visit at that time and she has not had any updated labs up to this point, Dr Dorris Fetch please advise?

## 2019-03-12 NOTE — Telephone Encounter (Signed)
She can get rx for  Insulin and glipizide for 30 days , if she can not come back, she has to stay with PMD for care and refills.

## 2019-03-12 NOTE — Telephone Encounter (Signed)
Done

## 2019-03-20 ENCOUNTER — Other Ambulatory Visit: Payer: Self-pay | Admitting: "Endocrinology

## 2019-03-20 ENCOUNTER — Other Ambulatory Visit: Payer: Self-pay

## 2019-03-20 DIAGNOSIS — E1165 Type 2 diabetes mellitus with hyperglycemia: Secondary | ICD-10-CM

## 2019-03-20 MED ORDER — NOVOLIN 70/30 RELION (70-30) 100 UNIT/ML ~~LOC~~ SUSP: SUBCUTANEOUS | 0 refills | Status: DC

## 2019-03-20 MED ORDER — GLIPIZIDE ER 5 MG PO TB24: 5.0000 mg | ORAL_TABLET | Freq: Every day | ORAL | 0 refills | Status: DC

## 2019-03-21 ENCOUNTER — Other Ambulatory Visit: Payer: Self-pay

## 2019-03-28 ENCOUNTER — Ambulatory Visit: Payer: Self-pay | Admitting: "Endocrinology

## 2019-04-02 ENCOUNTER — Other Ambulatory Visit (HOSPITAL_COMMUNITY)
Admission: RE | Admit: 2019-04-02 | Discharge: 2019-04-02 | Disposition: A | Discharge status: Home / Self Care | Payer: PRIVATE HEALTH INSURANCE | Source: Ambulatory Visit | Attending: "Endocrinology | Admitting: "Endocrinology

## 2019-04-02 DIAGNOSIS — E559 Vitamin D deficiency, unspecified: Secondary | ICD-10-CM | POA: Diagnosis present

## 2019-04-02 DIAGNOSIS — R5383 Other fatigue: Secondary | ICD-10-CM | POA: Insufficient documentation

## 2019-04-02 DIAGNOSIS — E1110 Type 2 diabetes mellitus with ketoacidosis without coma: Secondary | ICD-10-CM | POA: Diagnosis present

## 2019-04-02 DIAGNOSIS — E1165 Type 2 diabetes mellitus with hyperglycemia: Secondary | ICD-10-CM | POA: Diagnosis present

## 2019-04-02 LAB — COMPREHENSIVE METABOLIC PANEL
ALT: 44 U/L (ref 0–44)
AST: 43 U/L — ABNORMAL HIGH (ref 15–41)
Albumin: 3.7 g/dL (ref 3.5–5.0)
Alkaline Phosphatase: 69 U/L (ref 38–126)
Anion gap: 9 (ref 5–15)
BUN: 11 mg/dL (ref 6–20)
CO2: 21 mmol/L — ABNORMAL LOW (ref 22–32)
Calcium: 8.5 mg/dL — ABNORMAL LOW (ref 8.9–10.3)
Chloride: 105 mmol/L (ref 98–111)
Creatinine, Ser: 0.45 mg/dL (ref 0.44–1.00)
GFR calc Af Amer: >60 mL/min (ref >60)
GFR calc non Af Amer: >60 mL/min (ref >60)
Glucose, Bld: 177 mg/dL — ABNORMAL HIGH (ref 70–99)
Potassium: 3.9 mmol/L (ref 3.5–5.1)
Sodium: 135 mmol/L (ref 135–145)
Total Bilirubin: 0.4 mg/dL (ref 0.3–1.2)
Total Protein: 7.5 g/dL (ref 6.5–8.1)

## 2019-04-02 LAB — LIPID PANEL
Cholesterol: 234 mg/dL — ABNORMAL HIGH (ref 0–200)
HDL: 30 mg/dL — ABNORMAL LOW (ref >40)
LDL Cholesterol: UNDETERMINED mg/dL (ref 0–99)
Total CHOL/HDL Ratio: 7.8 RATIO
Triglycerides: 413 mg/dL — ABNORMAL HIGH (ref <150)
VLDL: UNDETERMINED mg/dL (ref 0–40)

## 2019-04-02 LAB — HEMOGLOBIN A1C
Hgb A1c MFr Bld: 9.9 % — ABNORMAL HIGH (ref 4.8–5.6)
Mean Plasma Glucose: 237.43 mg/dL

## 2019-04-02 LAB — LDL CHOLESTEROL, DIRECT: Direct LDL: 133 mg/dL — ABNORMAL HIGH (ref 0–99)

## 2019-04-02 LAB — VITAMIN D 25 HYDROXY (VIT D DEFICIENCY, FRACTURES): Vit D, 25-Hydroxy: 21.8 ng/mL — ABNORMAL LOW (ref 30–100)

## 2019-04-05 ENCOUNTER — Encounter: Payer: Self-pay | Admitting: "Endocrinology

## 2019-04-05 ENCOUNTER — Other Ambulatory Visit: Payer: Self-pay

## 2019-04-05 ENCOUNTER — Ambulatory Visit (INDEPENDENT_AMBULATORY_CARE_PROVIDER_SITE_OTHER): Payer: PRIVATE HEALTH INSURANCE | Admitting: "Endocrinology

## 2019-04-05 DIAGNOSIS — Z9119 Patient's noncompliance with other medical treatment and regimen: Secondary | ICD-10-CM | POA: Insufficient documentation

## 2019-04-05 DIAGNOSIS — E1165 Type 2 diabetes mellitus with hyperglycemia: Secondary | ICD-10-CM

## 2019-04-05 DIAGNOSIS — Z91199 Patient's noncompliance with other medical treatment and regimen due to unspecified reason: Secondary | ICD-10-CM

## 2019-04-05 MED ORDER — VITAMIN D3 125 MCG (5000 UT) PO CAPS: 5000.0000 [IU] | ORAL_CAPSULE | Freq: Every day | ORAL | 1 refills | Status: DC

## 2019-04-05 MED ORDER — FENOFIBRATE 48 MG PO TABS: 48.0000 mg | ORAL_TABLET | Freq: Every day | ORAL | 3 refills | Status: DC

## 2019-04-05 NOTE — Progress Notes (Signed)
04/05/2019, 10:51 AM                                                    Endocrinology Telehealth Visit Follow up Note -During COVID -19 Pandemic  This visit type was conducted due to national recommendations for restrictions regarding the COVID-19 Pandemic  in an effort to limit this patient's exposure and mitigate transmission of the corona virus.  Due to her co-morbid illnesses, [name] is at  moderate to high risk for complications without adequate follow up.  This format is felt to be most appropriate for her at this time.  I connected with this patient on 04/05/2019   by telephone and verified that I am speaking with the correct person using two identifiers. Ann Richards, [birth year]. she has verbally consented to this visit. All issues noted in this document were discussed and addressed. The format was not optimal for physical exam.    Subjective:    Patient ID: Ann Richards, female    DOB: 02/24/1991.  Ann Richards is being engaged in telehealth via telephone in follow-up after she was seen in consultation for management of currently uncontrolled symptomatic diabetes requested by  [name], MD.   Past Medical History:  Diagnosis Date  . Anxiety   . Depression   . Diabetes mellitus without complication (HCC)   . Insomnia     Past Surgical History:  Procedure Laterality Date  . LAPAROSCOPIC APPENDECTOMY N/A 08/25/2016   Procedure: APPENDECTOMY LAPAROSCOPIC;  Surgeon: Franky Macho, MD;  Location: [facility];  Service: General;  Laterality: N/A;    Social History   Socioeconomic History  . Marital status: Single    Spouse name: Not on file  . Number of children: Not on file  . Years of education: Not on file  . Highest education level: Not on file  Occupational History  . Not on file  Social Needs  . Financial  resource strain: Not on file  . Food insecurity    Worry: Not on file    Inability: Not on file  . Transportation needs    Medical: Not on file    Non-medical: Not on file  Tobacco Use  . Smoking status: Former Smoker    Packs/day: 0.25    Years: 1.00    Pack years: 0.25    Types: Cigarettes    Start date: 03/14/2008    Quit date: 05/14/2009    Years since quitting: 9.8  . Smokeless tobacco: Never Used  Substance and Sexual Activity  . Alcohol use: Yes    Alcohol/week: 0.0 standard drinks    Comment: ocassional  . Drug use: No  . Sexual activity: Not Currently    Partners: Male    Birth control/protection: Condom  Lifestyle  . Physical activity  Days per week: Not on file    Minutes per session: Not on file  . Stress: Not on file  Relationships  . Social Herbalist on phone: Not on file    Gets together: Not on file    Attends religious service: Not on file    Active member of club or organization: Not on file    Attends meetings of clubs or organizations: Not on file    Relationship status: Not on file  Other Topics Concern  . Not on file  Social History Narrative  . Not on file    Family History  Problem Relation Age of Onset  . Diabetes Father     Outpatient Encounter Medications as of 04/05/2019  Medication Sig  . acetaminophen (TYLENOL) 500 MG tablet Take 500 mg by mouth every 6 (six) hours as needed for mild pain or moderate pain.  Marland Kitchen albuterol (VENTOLIN HFA) 108 (90 Base) MCG/ACT inhaler INHALE TWO PUFFS INTO THE LUNGS EVERY 4 HOURS AS NEEDED (Patient taking differently: Inhale 2 puffs into the lungs every 4 (four) hours as needed for wheezing or shortness of breath. )  . azithromycin (ZITHROMAX Z-PAK) 250 MG tablet Take 2 tablets (500 mg) on  Day 1,  followed by 1 tablet (250 mg) once daily on Days 2 through 5.  . bismuth subsalicylate (PEPTO BISMOL) 262 MG/15ML suspension Take 30 mLs by mouth every 6 (six) hours as needed for indigestion or  diarrhea or loose stools.  . Etonogestrel (IMPLANON [place]) Inject into the skin once.  . Fructose-Dextrose-Phosphor Acd (SB ANTI-NAUSEA PO) Take 1 capsule by mouth once as needed ([employer] BRAND-OTC).  Marland Kitchen glipiZIDE (GLUCOTROL XL) 5 MG 24 hr tablet Take 1 tablet by mouth once daily with breakfast  . hydroxypropyl methylcellulose / hypromellose (ISOPTO TEARS / GONIOVISC) 2.5 % ophthalmic solution Place 1 drop into both eyes 3 (three) times daily as needed for dry eyes.  Marland Kitchen ibuprofen (ADVIL,MOTRIN) 200 MG tablet Take 600-800 mg by mouth every 6 (six) hours as needed for moderate pain.  . Multiple Vitamin (MULTIVITAMIN WITH MINERALS) TABS tablet Take 1 tablet by mouth daily.  Marland Kitchen NOVOLIN 70/30 RELION (70-30) 100 UNIT/ML injection INJECT 15 UNITS SUBCUTANEOUSLY TWICE DAILY WITH A MEAL  . traMADol (ULTRAM) 50 MG tablet Take 1 tablet (50 mg total) by mouth every 6 (six) hours as needed for moderate pain (if tylenol not effective).   No facility-administered encounter medications on file as of 04/05/2019.     ALLERGIES: Allergies  Allergen Reactions  . Metformin And Related     Sensitivity to med; stomach issues    VACCINATION STATUS: Immunization History  Administered Date(s) Administered  . PPD Test 10/17/2017    Diabetes She presents for her follow-up diabetic visit. She has type 2 diabetes mellitus. Her disease course has been worsening. There are no hypoglycemic associated symptoms. Associated symptoms include polydipsia and polyuria. There are no hypoglycemic complications. Symptoms are worsening. There are no diabetic complications. Risk factors for coronary artery disease include diabetes mellitus. Current diabetic treatment includes insulin injections and oral agent (monotherapy). She is compliant with treatment none of the time. Her weight is fluctuating minimally. She is following a generally unhealthy diet. When asked about meal planning, she reported none. She has not had a previous visit  with a dietitian. She participates in exercise intermittently. There is no compliance with monitoring of blood glucose. (She admits that she has not been monitoring blood glucose at all.  Her A1c  is higher at 9.9% increasing from 7.9% .   In October 2019,  she was admitted with diabetes ketoacidosis, was discharged on insulin Novolin 70/30 15 units twice daily.  She does not tolerate metformin.) An ACE inhibitor/angiotensin II receptor blocker is not being taken.    Review of systems: Limited as above.  Objective:    There were no vitals taken for this visit.  Wt Readings from Last 3 Encounters:  10/03/18 165 lb (74.8 kg)  08/14/18 178 lb (80.7 kg)  04/18/18 164 lb 7.4 oz (74.6 kg)     Physical Exam    CMP ( most recent) CMP     Component Value Date/Time   NA 135 04/02/2019 1042   K 3.9 04/02/2019 1042   CL 105 04/02/2019 1042   CO2 21 (L) 04/02/2019 1042   GLUCOSE 177 (H) 04/02/2019 1042   BUN 11 04/02/2019 1042   CREATININE 0.45 04/02/2019 1042   CALCIUM 8.5 (L) 04/02/2019 1042   PROT 7.5 04/02/2019 1042   ALBUMIN 3.7 04/02/2019 1042   AST 43 (H) 04/02/2019 1042   ALT 44 04/02/2019 1042   ALKPHOS 69 04/02/2019 1042   BILITOT 0.4 04/02/2019 1042   GFRNONAA >60 04/02/2019 1042   GFRAA >60 04/02/2019 1042     Diabetic Labs (most recent): Lab Results  Component Value Date   HGBA1C 9.9 (H) 04/02/2019   HGBA1C 7.9 (A) 08/14/2018   HGBA1C 10.9 (H) 04/17/2018     Lipid Panel ( most recent) Lipid Panel     Component Value Date/Time   CHOL 234 (H) 04/02/2019 1042   TRIG 413 (H) 04/02/2019 1042   HDL 30 (L) 04/02/2019 1042   CHOLHDL 7.8 04/02/2019 1042   VLDL UNABLE TO CALCULATE IF TRIGLYCERIDE OVER 400 mg/dL 16/03/9603 5409   LDLCALC UNABLE TO CALCULATE IF TRIGLYCERIDE OVER 400 mg/dL 81/19/1478 2956   LDLDIRECT 133.0 (H) 04/02/2019 1042      No results found for: TSH, FREET4         Assessment & Plan:   1.  Uncontrolled type 2 diabetes Extremely high  risk - Nicol L Stull has currently uncontrolled symptomatic type 2 DM since 28 years of age. She missed her appointment since April 2020.  She admits that she is not monitoring blood glucose at all.  Her A1c is higher at 9.9% increasing from 7.9%. She has a history of diabetic ketoacidosis which required hospitalization in October 2019.  Her recent labs are reviewed with her.   -Once again, I had a long discussion with her about the progressive nature of diabetes and the pathology behind its complications. -Her care is complicated by noncompliance/nonadherence , she does not report gross complications from diabetes yet, however she remains at a high risk for more acute and chronic complications which include CAD, CVA, CKD, retinopathy, and neuropathy. These are all discussed in detail with her.  - I have counseled her on diet management by adopting a carbohydrate restricted/protein rich diet.  - she  admits there is a room for improvement in her diet and drink choices. -  Suggestion is made for her to avoid simple carbohydrates  from her diet including Cakes, Sweet Desserts / Pastries, Ice Cream, Soda (diet and regular), Sweet Tea, Candies, Chips, Cookies, Sweet Pastries,  Store Bought Juices, Alcohol in Excess of  1-2 drinks a day, Artificial Sweeteners, Coffee Creamer, and "Sugar-free" Products. This will help patient to have stable blood glucose profile and potentially avoid unintended weight gain.   -  I encouraged her to switch to  unprocessed or minimally processed complex starch and increased protein intake (animal or plant source), fruits, and vegetables.  - she is advised to stick to a routine mealtimes to eat 3 meals  a day and avoid unnecessary snacks ( to snack only to correct hypoglycemia).   - she will be scheduled with Norm SaltPenny Crumpton, RDN, CDE for individualized diabetes education.  - I have approached her with the following individualized plan to manage diabetes and  patient agrees:   -Considering her loss of control, and current glycemic burden she will likely require intensive treatment with basal/bolus insulin at a higher dose. -However, her new commitment for monitoring makes it difficult to adjust her doses. -She is urged to start strict monitoring of blood glucose 4 times a day- before meals and at bedtime and continue  Novolin 70/30 15 units with breakfast and 15 units with supper when pre-meal blood glucose readings are above 90 mg/dL.   He expressed concern on cost of medications, and wishes to stay on cheaper insulin Novolin 70/30.  - she is warned not to take insulin without proper monitoring per orders.  - she is encouraged to call clinic for blood glucose levels less than 70 or above 300 mg /dl. - she is advised to continue glipizide 5 mg XL p.o. daily with her breakfast.   -She does not tolerate metformin, causing significant GI intolerance. - she will be considered for incretin therapy as appropriate next visit.    2) Blood Pressure /Hypertension: She is not  not taking any antihypertensive medications for now.  3) Lipids/Hyperlipidemia: She has significant dyslipidemia with hypertriglyceridemia 413, will benefit from fenofibrate, and statins.  She will be started on fenofibrate, will be considered for statin later.   4) vitamin D deficiency: She will be started on vitamin D3 5000 units daily x90 days.  - she is  advised to maintain close follow up with Merlyn AlbertLuking, William S, MD for primary care needs, as well as her other providers for optimal and coordinated care.  - Patient Care Time Today:  25 min, of which >50% was spent in  counseling and the rest reviewing her  current and  previous labs/studies, previous treatments, her blood glucose readings, and medications' doses and developing a plan for long-term care based on the latest recommendations for standards of care.   Chauncy Leanominique L Remington participated in the discussions, expressed  understanding, and voiced agreement with the above plans.  All questions were answered to her satisfaction. she is encouraged to contact clinic should she have any questions or concerns prior to her return visit.   Follow up plan: - Return in about 10 days (around 04/15/2019) for Follow up with Meter and Logs Only - no Labs, Include 8 log sheets.  Marquis LunchGebre Debra Calabretta, MD [facility] [address] [place], [place] [address] Phone: [phone]  Fax: [phone]    04/05/2019, 10:51 AM  This note was partially dictated with voice recognition software. Similar sounding words can be transcribed inadequately or may not  be corrected upon review.

## 2019-04-08 ENCOUNTER — Other Ambulatory Visit: Payer: Self-pay

## 2019-04-08 ENCOUNTER — Telehealth: Payer: Self-pay | Admitting: "Endocrinology

## 2019-04-08 ENCOUNTER — Other Ambulatory Visit: Payer: Self-pay | Admitting: "Endocrinology

## 2019-04-08 DIAGNOSIS — E1165 Type 2 diabetes mellitus with hyperglycemia: Secondary | ICD-10-CM

## 2019-04-08 NOTE — Telephone Encounter (Signed)
Pt was given Vitamin D due to low Vit d level. Called pt. No answer. VM full.

## 2019-04-08 NOTE — Telephone Encounter (Signed)
Pt left a voicemail that a medication was called in her that she was unaware of ( did not leave the name ). She would like you to call her

## 2019-04-08 NOTE — Telephone Encounter (Signed)
Pt.notified

## 2019-04-17 ENCOUNTER — Other Ambulatory Visit: Payer: Self-pay

## 2019-04-17 ENCOUNTER — Encounter: Payer: Self-pay | Admitting: "Endocrinology

## 2019-04-17 ENCOUNTER — Ambulatory Visit (INDEPENDENT_AMBULATORY_CARE_PROVIDER_SITE_OTHER): Payer: PRIVATE HEALTH INSURANCE | Admitting: "Endocrinology

## 2019-04-17 DIAGNOSIS — E559 Vitamin D deficiency, unspecified: Secondary | ICD-10-CM | POA: Diagnosis not present

## 2019-04-17 DIAGNOSIS — E1165 Type 2 diabetes mellitus with hyperglycemia: Secondary | ICD-10-CM | POA: Diagnosis not present

## 2019-04-17 DIAGNOSIS — E782 Mixed hyperlipidemia: Secondary | ICD-10-CM

## 2019-04-17 MED ORDER — NOVOLIN 70/30 RELION (70-30) 100 UNIT/ML ~~LOC~~ SUSP: 30.0000 [IU] | Freq: Two times a day (BID) | SUBCUTANEOUS | 2 refills | Status: DC

## 2019-04-17 NOTE — Patient Instructions (Signed)

## 2019-04-17 NOTE — Progress Notes (Signed)
04/17/2019, 2:21 PM                                                    Endocrinology Telehealth Visit Follow up Note -During COVID -19 Pandemic  This visit type was conducted due to national recommendations for restrictions regarding the COVID-19 Pandemic  in an effort to limit this patient's exposure and mitigate transmission of the corona virus.  Due to her co-morbid illnesses, Ann Richards is at  moderate to high risk for complications without adequate follow up.  This format is felt to be most appropriate for her at this time.  I connected with this patient on 04/17/2019   by telephone and verified that I am speaking with the correct person using two identifiers. Ann Richards, 12-10-1990. she has verbally consented to this visit. All issues noted in this document were discussed and addressed. The format was not optimal for physical exam.    Subjective:    Patient ID: Ann Richards, female    DOB: [birth year].  Ann Richards is being engaged in telehealth via telephone in follow-up after she was seen in consultation for management of currently uncontrolled symptomatic diabetes requested by  Ann Albert, MD.   Past Medical History:  Diagnosis Date  . Anxiety   . Depression   . Diabetes mellitus without complication (HCC)   . Insomnia     Past Surgical History:  Procedure Laterality Date  . LAPAROSCOPIC APPENDECTOMY N/A 08/25/2016   Procedure: APPENDECTOMY LAPAROSCOPIC;  Surgeon: Franky Macho, MD;  Location: [facility];  Service: General;  Laterality: N/A;    Social History   Socioeconomic History  . Marital status: Single    Spouse name: Not on file  . Number of children: Not on file  . Years of education: Not on file  . Highest education level: Not on file  Occupational History  . Not on file  Social Needs  . Financial  resource strain: Not on file  . Food insecurity    Worry: Not on file    Inability: Not on file  . Transportation needs    Medical: Not on file    Non-medical: Not on file  Tobacco Use  . Smoking status: Former Smoker    Packs/day: 0.25    Years: 1.00    Pack years: 0.25    Types: Cigarettes    Start date: 03/14/2008    Quit date: 05/14/2009    Years since quitting: 9.9  . Smokeless tobacco: Never Used  Substance and Sexual Activity  . Alcohol use: Yes    Alcohol/week: 0.0 standard drinks    Comment: ocassional  . Drug use: No  . Sexual activity: Not Currently    Partners: Male    Birth control/protection: Condom  Lifestyle  . Physical activity  Days per week: Not on file    Minutes per session: Not on file  . Stress: Not on file  Relationships  . Social Herbalist on phone: Not on file    Gets together: Not on file    Attends religious service: Not on file    Active member of club or organization: Not on file    Attends meetings of clubs or organizations: Not on file    Relationship status: Not on file  Other Topics Concern  . Not on file  Social History Narrative  . Not on file    Family History  Problem Relation Age of Onset  . Diabetes Father     Outpatient Encounter Medications as of 04/17/2019  Medication Sig  . acetaminophen (TYLENOL) 500 MG tablet Take 500 mg by mouth every 6 (six) hours as needed for mild pain or moderate pain.  Marland Kitchen albuterol (VENTOLIN HFA) 108 (90 Base) MCG/ACT inhaler INHALE TWO PUFFS INTO THE LUNGS EVERY 4 HOURS AS NEEDED (Patient taking differently: Inhale 2 puffs into the lungs every 4 (four) hours as needed for wheezing or shortness of breath. )  . azithromycin (ZITHROMAX Z-PAK) 250 MG tablet Take 2 tablets (500 mg) on  Day 1,  followed by 1 tablet (250 mg) once daily on Days 2 through 5.  . bismuth subsalicylate (PEPTO BISMOL) 262 MG/15ML suspension Take 30 mLs by mouth every 6 (six) hours as needed for indigestion or  diarrhea or loose stools.  . Cholecalciferol (VITAMIN D3) 125 MCG (5000 UT) CAPS Take 1 capsule (5,000 Units total) by mouth daily.  . Etonogestrel (IMPLANON [place]) Inject into the skin once.  . fenofibrate (TRICOR) 48 MG tablet Take 1 tablet (48 mg total) by mouth daily.  . Fructose-Dextrose-Phosphor Acd (SB ANTI-NAUSEA PO) Take 1 capsule by mouth once as needed ([employer] BRAND-OTC).  Marland Kitchen glipiZIDE (GLUCOTROL XL) 5 MG 24 hr tablet Take 1 tablet by mouth once daily with breakfast  . hydroxypropyl methylcellulose / hypromellose (ISOPTO TEARS / GONIOVISC) 2.5 % ophthalmic solution Place 1 drop into both eyes 3 (three) times daily as needed for dry eyes.  Marland Kitchen ibuprofen (ADVIL,MOTRIN) 200 MG tablet Take 600-800 mg by mouth every 6 (six) hours as needed for moderate pain.  Marland Kitchen insulin NPH-regular Human (NOVOLIN 70/30 RELION) (70-30) 100 UNIT/ML injection Inject 30 Units into the skin 2 (two) times daily with a meal.  . Multiple Vitamin (MULTIVITAMIN WITH MINERALS) TABS tablet Take 1 tablet by mouth daily.  . traMADol (ULTRAM) 50 MG tablet Take 1 tablet (50 mg total) by mouth every 6 (six) hours as needed for moderate pain (if tylenol not effective).  . [DISCONTINUED] NOVOLIN 70/30 RELION (70-30) 100 UNIT/ML injection INJECT 15 UNITS SUBCUTANEOUSLY TWICE DAILY WITH A MEAL   No facility-administered encounter medications on file as of 04/17/2019.     ALLERGIES: Allergies  Allergen Reactions  . Metformin And Related     Sensitivity to med; stomach issues    VACCINATION STATUS: Immunization History  Administered Date(s) Administered  . PPD Test 10/17/2017    Diabetes She presents for her follow-up diabetic visit. She has type 2 diabetes mellitus. Her disease course has been worsening. There are no hypoglycemic associated symptoms. Associated symptoms include polydipsia and polyuria. There are no hypoglycemic complications. Symptoms are worsening. There are no diabetic complications. Risk factors for  coronary artery disease include diabetes mellitus. Current diabetic treatment includes insulin injections and oral agent (monotherapy). She is compliant with treatment none of  the time. Her weight is fluctuating minimally. She is following a generally unhealthy diet. When asked about meal planning, she reported none. She has not had a previous visit with a dietitian. She participates in exercise intermittently. Her breakfast blood glucose range is generally >200 mg/dl. Her lunch blood glucose range is generally >200 mg/dl. Her dinner blood glucose range is generally >200 mg/dl. Her overall blood glucose range is >200 mg/dl. (She reports slightly improving, however persistently above target glycemic profile.  Breakfast time readings range from 215-242 , prelunch between 190-222, presupper between 199-263    Her recent A1c is higher at 9.9% increasing from 7.9% .   In October 2019,  she was admitted with diabetes ketoacidosis, was discharged on insulin Novolin 70/30 15 units twice daily.  She does not tolerate metformin.) An ACE inhibitor/angiotensin II receptor blocker is not being taken.    Review of systems: Limited as above.  Objective:    There were no vitals taken for this visit.  Wt Readings from Last 3 Encounters:  10/03/18 165 lb (74.8 kg)  08/14/18 178 lb (80.7 kg)  04/18/18 164 lb 7.4 oz (74.6 kg)     Physical Exam    CMP ( most recent) CMP     Component Value Date/Time   NA 135 04/02/2019 1042   K 3.9 04/02/2019 1042   CL 105 04/02/2019 1042   CO2 21 (L) 04/02/2019 1042   GLUCOSE 177 (H) 04/02/2019 1042   BUN 11 04/02/2019 1042   CREATININE 0.45 04/02/2019 1042   CALCIUM 8.5 (L) 04/02/2019 1042   PROT 7.5 04/02/2019 1042   ALBUMIN 3.7 04/02/2019 1042   AST 43 (H) 04/02/2019 1042   ALT 44 04/02/2019 1042   ALKPHOS 69 04/02/2019 1042   BILITOT 0.4 04/02/2019 1042   GFRNONAA >60 04/02/2019 1042   GFRAA >60 04/02/2019 1042     Diabetic Labs (most recent): Lab  Results  Component Value Date   HGBA1C 9.9 (H) 04/02/2019   HGBA1C 7.9 (A) 08/14/2018   HGBA1C 10.9 (H) 04/17/2018     Lipid Panel ( most recent) Lipid Panel     Component Value Date/Time   CHOL 234 (H) 04/02/2019 1042   TRIG 413 (H) 04/02/2019 1042   HDL 30 (L) 04/02/2019 1042   CHOLHDL 7.8 04/02/2019 1042   VLDL UNABLE TO CALCULATE IF TRIGLYCERIDE OVER 400 mg/dL 69/62/952810/13/2020 41321042   LDLCALC UNABLE TO CALCULATE IF TRIGLYCERIDE OVER 400 mg/dL 44/01/027210/13/2020 53661042   LDLDIRECT 133.0 (H) 04/02/2019 1042       Assessment & Plan:   1.  Uncontrolled type 2 diabetes Extremely high risk - Ann Richards has currently uncontrolled symptomatic type 2 DM since 28 years of age. She reports better compliance with monitoring, still persistently above target glycemic profile averaging between 200 to 250 mg..  Her most recent A1c was 9.9%.    She has a history of diabetic ketoacidosis which required hospitalization in October 2019.  Her recent labs are reviewed with her.   -Once again, I had a long discussion with her about the progressive nature of diabetes and the pathology behind its complications. -Her care is complicated by noncompliance/nonadherence , she does not report gross complications from diabetes yet, however she remains at a high risk for more acute and chronic complications which include CAD, CVA, CKD, retinopathy, and neuropathy. These are all discussed in detail with her.  - I have counseled her on diet management by adopting a carbohydrate restricted/protein rich diet.  -  she  admits there is a room for improvement in her diet and drink choices. -  Suggestion is made for her to avoid simple carbohydrates  from her diet including Cakes, Sweet Desserts / Pastries, Ice Cream, Soda (diet and regular), Sweet Tea, Candies, Chips, Cookies, Sweet Pastries,  Store Bought Juices, Alcohol in Excess of  1-2 drinks a day, Artificial Sweeteners, Coffee Creamer, and "Sugar-free" Products.  This will help patient to have stable blood glucose profile and potentially avoid unintended weight gain.   - I encouraged her to switch to  unprocessed or minimally processed complex starch and increased protein intake (animal or plant source), fruits, and vegetables.  - she is advised to stick to a routine mealtimes to eat 3 meals  a day and avoid unnecessary snacks ( to snack only to correct hypoglycemia).   - she will be scheduled with Norm Salt, RDN, CDE for individualized diabetes education.  - I have approached her with the following individualized plan to manage diabetes and patient agrees:   -Considering, consistently above target glycemic profile she will need a higher dose of insulin in order for her to achieve control of diabetes to target.   -She will continue to benefit from a cheaper, premixed insulin for compliance reasons. -She is advised to increase her Novolin 70/30 to 30 units with breakfast and 30 units with supper when pre-meal blood glucose readings are above 90 mg/dL.   She expressed concern on cost of medications, and wishes to stay on cheaper insulin Novolin 70/30.  - she is warned not to take insulin without proper monitoring per orders.  - she is encouraged to call clinic for blood glucose levels less than 70 or above 300 mg /dl. - she is advised to continue glipizide 5 mg XL p.o. daily with her breakfast.   -She does not tolerate metformin, causing significant GI intolerance. - she will be considered for incretin therapy as appropriate next visit.   2) Blood Pressure /Hypertension: She is not  not taking any antihypertensive medications for now.  3) Lipids/Hyperlipidemia: She has significant dyslipidemia with hypertriglyceridemia 413, recently initiated on fenofibrate.   She is advised to continue fenofibrate 48 mg p.o. daily,  will be considered for statin later.   4) vitamin D deficiency: She will be started on vitamin D3 5000 units daily x90  days.  - she is  advised to maintain close follow up with Ann Albert, MD for primary care needs, as well as her other providers for optimal and coordinated care.  - Patient Care Time Today:  25 min, of which >50% was spent in  counseling and the rest reviewing her  current and  previous labs/studies, previous treatments, her blood glucose readings, and medications' doses and developing a plan for long-term care based on the latest recommendations for standards of care.   Chauncy Lean Bergeron participated in the discussions, expressed understanding, and voiced agreement with the above plans.  All questions were answered to her satisfaction. she is encouraged to contact clinic should she have any questions or concerns prior to her return visit.   Follow up plan: - Return in about 3 months (around 07/18/2019) for Bring Meter and Logs- A1c in Office, Include 8 log sheets.  Marquis Lunch, MD [facility] [address] [place], [place] [address] Phone: [phone]  Fax: [phone]    04/17/2019, 2:21 PM  This note was partially dictated with voice recognition software. Similar sounding words can be  transcribed inadequately or may not  be corrected upon review.

## 2019-06-21 ENCOUNTER — Other Ambulatory Visit: Payer: Self-pay | Admitting: "Endocrinology

## 2019-06-21 DIAGNOSIS — E1165 Type 2 diabetes mellitus with hyperglycemia: Secondary | ICD-10-CM

## 2019-07-15 ENCOUNTER — Other Ambulatory Visit: Payer: Self-pay

## 2019-07-15 ENCOUNTER — Telehealth: Payer: Self-pay | Admitting: "Endocrinology

## 2019-07-15 DIAGNOSIS — E1165 Type 2 diabetes mellitus with hyperglycemia: Secondary | ICD-10-CM

## 2019-07-15 MED ORDER — NOVOLIN 70/30 RELION (70-30) 100 UNIT/ML ~~LOC~~ SUSP: 30.0000 [IU] | Freq: Two times a day (BID) | SUBCUTANEOUS | 2 refills | Status: DC

## 2019-07-15 MED ORDER — "INSULIN SYRINGE-NEEDLE U-100 30G X 1/2"" 0.3 ML MISC": 1.0000 | 0 refills | Status: DC

## 2019-07-15 NOTE — Telephone Encounter (Signed)
Patient is requesting refill   insulin NPH-regular Human (NOVOLIN 70/30 RELION) (70-30) 100 UNIT/ML injection    and she needs syringes 31 gauge 6 mm. [facility] [place]

## 2019-07-15 NOTE — Telephone Encounter (Signed)
Sent rx refills in to [facility].

## 2019-07-17 ENCOUNTER — Telehealth: Payer: Self-pay | Admitting: Advanced Practice Midwife

## 2019-07-17 NOTE — Telephone Encounter (Signed)
Called the patient and asked her to please call the office tomorrow prior to coming to the office to make sure that we were open.  We may possibly have inclement weather.

## 2019-07-17 NOTE — Telephone Encounter (Signed)
Tried to reach the patient to remind her of restrictions/appointment, mailbox is full.

## 2019-07-18 ENCOUNTER — Other Ambulatory Visit: Payer: Self-pay

## 2019-07-18 ENCOUNTER — Ambulatory Visit (INDEPENDENT_AMBULATORY_CARE_PROVIDER_SITE_OTHER): Payer: BLUE CROSS/BLUE SHIELD | Admitting: Advanced Practice Midwife

## 2019-07-18 ENCOUNTER — Encounter: Payer: Self-pay | Admitting: Advanced Practice Midwife

## 2019-07-18 VITALS — BP 152/80 | HR 92 | Ht 62.0 in | Wt 188.0 lb

## 2019-07-18 DIAGNOSIS — L680 Hirsutism: Secondary | ICD-10-CM

## 2019-07-18 DIAGNOSIS — Z3046 Encounter for surveillance of implantable subdermal contraceptive: Secondary | ICD-10-CM | POA: Diagnosis not present

## 2019-07-18 NOTE — Progress Notes (Signed)
HPI:  Ann Richards 29 y.o. here for Nexplanon removal.  Her future plans for birth control are abstinence. She has had this device since 2009 (expired in 2012)  Finally has good insurance and wants it out. Has started growing chin hair. Has fine velvety skin around neck. .  Past Medical History: Past Medical History:  Diagnosis Date  . Anxiety   . Depression   . Diabetes mellitus without complication (HCC)   . Insomnia     Past Surgical History: Past Surgical History:  Procedure Laterality Date  . LAPAROSCOPIC APPENDECTOMY N/A 08/25/2016   Procedure: APPENDECTOMY LAPAROSCOPIC;  Surgeon: Franky Macho, MD;  Location: [facility];  Service: General;  Laterality: N/A;    Family History: Family History  Problem Relation Age of Onset  . Diabetes Father     Social History: Social History   Tobacco Use  . Smoking status: Former Smoker    Packs/day: 0.25    Years: 1.00    Pack years: 0.25    Types: Cigarettes    Start date: 03/14/2008    Quit date: 05/14/2009    Years since quitting: 10.1  . Smokeless tobacco: Never Used  Substance Use Topics  . Alcohol use: Yes    Alcohol/week: 0.0 standard drinks    Comment: ocassional  . Drug use: No    Allergies:  Allergies  Allergen Reactions  . Metformin And Related     Sensitivity to med; stomach issues    Meds: (Not in a hospital admission)     Patient given informed consent for removal of her Nexplanon, time out was performed.  Signed copy in the chart.  Appropriate time out taken. Implanon site identified.  Area prepped in usual sterile fashon. One cc of 1% lidocaine was used to anesthetize the area at the distal end of the implant. A small stab incision was made right beside the implant on the distal portion.  The Nexplanon rod was grasped using hemostats and removed without difficulty.  There was less than 3 cc blood loss. There were no complications.  A small amount of antibiotic ointment and steri-strips were applied  over the small incision.  A pressure bandage was applied to reduce any bruising.  The patient tolerated the procedure well and was given post procedure instructions.   Will check labs for hirsutism Info on acanthosis nigricans given

## 2019-07-18 NOTE — Patient Instructions (Signed)
Acanthosis Nigricans Acanthosis nigricans is a condition in which dark, velvety markings appear on the skin. What are the causes? This condition may be caused by:  A hormonal or glandular disorder, such as diabetes.  Obesity.  Certain medicines, such as birth control pills.  A tumor. This is rare. Some people inherit the condition from their parents. What increases the risk? You are more likely to develop this condition if you:  Have a hormonal or glandular disorder.  Are overweight.  Take certain medicines.  Have certain cancers, especially stomach cancer.  Have dark-colored skin (dark complexion). What are the signs or symptoms? The main symptom of this condition is velvety markings on the skin that are light brown, black, or grayish in color.  The markings usually appear on the face. They may also appear in skin fold areas at the neck, armpits, inner thighs, and groin.  In severe cases, markings may also appear on the lips, hands, breasts, eyelids, and mouth. How is this diagnosed? This condition may be diagnosed based on your symptoms.  A skin sample may be removed for testing (skin biopsy).  You may also have tests to help determine the cause of the condition. How is this treated? Treatment for this condition depends on the cause. Treatment may involve reducing insulin levels, which are often high in people who have this condition. Insulin levels can be reduced with:  Dietary changes, such as avoiding starchy foods and sugars.  Losing weight.  Medicines. Sometimes, treatment involves:  Medicines to improve the appearance of the skin.  Laser treatment to improve the appearance of the skin.  Surgical removal of the skin markings (dermabrasion). Follow these instructions at home:  Follow diet instructions from your health care provider.  Lose weight if you are overweight.  Take over-the-counter and prescription medicines only as told by your health care  provider.  Keep all follow-up visits as told by your health care provider. This is important. Contact a health care provider if:  New skin markings develop on a part of the body where they rarely develop, such as on your lips, hands, breasts, eyelids, or mouth.  The condition recurs for an unknown reason. Summary  Acanthosis nigricans is a condition in which dark, velvety markings appear on the skin.  Treatment for this condition depends on the cause. Treatment may include dietary changes, medicines, laser treatment, or surgery.  Take over-the-counter and prescription medicines only as told by your health care provider.  Contact a health care provider if new skin markings develop on a part of the body where they rarely develop, such as on your lips, hands, breasts, eyelids, or mouth.  Keep all follow-up visits as told by your health care provider. This is important. This information is not intended to replace advice given to you by your health care provider. Make sure you discuss any questions you have with your health care provider. Document Revised: 10/16/2017 Document Reviewed: 10/16/2017 Elsevier Patient Education  2020 [employer]  

## 2019-07-19 ENCOUNTER — Ambulatory Visit: Payer: PRIVATE HEALTH INSURANCE | Admitting: "Endocrinology

## 2019-07-22 ENCOUNTER — Telehealth: Payer: Self-pay | Admitting: *Deleted

## 2019-07-22 ENCOUNTER — Other Ambulatory Visit: Payer: Self-pay | Admitting: "Endocrinology

## 2019-07-22 ENCOUNTER — Encounter: Payer: Self-pay | Admitting: Family Medicine

## 2019-07-22 MED ORDER — "INSULIN SYRINGE-NEEDLE U-100 30G X 5/16"" 0.3 ML MISC": 2 refills | Status: DC

## 2019-07-22 NOTE — Telephone Encounter (Signed)
Returning patient's call. VM full.

## 2019-07-22 NOTE — Telephone Encounter (Signed)
Patient left message with questions regarding ibuprofen.   Returned patient's call. VM full.

## 2019-07-23 ENCOUNTER — Telehealth: Payer: Self-pay | Admitting: *Deleted

## 2019-07-23 NOTE — Telephone Encounter (Signed)
Patient is requesting 800mg  Ibuprofen tablets be sent to her pharmacy. States she takes them daily for severe cramping and would like to only take 1 tablet as opposed to 4 and her insurance will cover it.

## 2019-07-25 ENCOUNTER — Other Ambulatory Visit: Payer: Self-pay | Admitting: Advanced Practice Midwife

## 2019-07-25 MED ORDER — IBUPROFEN 800 MG PO TABS: 800.0000 mg | ORAL_TABLET | Freq: Three times a day (TID) | ORAL | 1 refills | Status: DC | PRN

## 2019-07-25 NOTE — Progress Notes (Signed)
Motrin for cramps.

## 2019-07-26 ENCOUNTER — Other Ambulatory Visit: Payer: Self-pay

## 2019-07-26 ENCOUNTER — Encounter: Payer: Self-pay | Admitting: "Endocrinology

## 2019-07-26 ENCOUNTER — Ambulatory Visit (INDEPENDENT_AMBULATORY_CARE_PROVIDER_SITE_OTHER): Payer: BLUE CROSS/BLUE SHIELD | Admitting: "Endocrinology

## 2019-07-26 VITALS — BP 145/93 | HR 96 | Ht 61.0 in | Wt 190.2 lb

## 2019-07-26 DIAGNOSIS — E559 Vitamin D deficiency, unspecified: Secondary | ICD-10-CM | POA: Diagnosis not present

## 2019-07-26 DIAGNOSIS — E782 Mixed hyperlipidemia: Secondary | ICD-10-CM | POA: Diagnosis not present

## 2019-07-26 DIAGNOSIS — E1165 Type 2 diabetes mellitus with hyperglycemia: Secondary | ICD-10-CM | POA: Diagnosis not present

## 2019-07-26 LAB — POCT GLYCOSYLATED HEMOGLOBIN (HGB A1C): Hemoglobin A1C: 10.3 % — AB (ref 4.0–5.6)

## 2019-07-26 NOTE — Progress Notes (Signed)
07/26/2019, 1:14 PM   Endocrinology follow-up note    Subjective:    Patient ID: Ann Richards, female    DOB: 1991-04-15.  Anamarie L Kulish is being seen in follow-up for management of currently uncontrolled symptomatic diabetes requested by  [name], MD.   Past Medical History:  Diagnosis Date  . Anxiety   . Depression   . Diabetes mellitus without complication (HCC)   . Insomnia     Past Surgical History:  Procedure Laterality Date  . LAPAROSCOPIC APPENDECTOMY N/A 08/25/2016   Procedure: APPENDECTOMY LAPAROSCOPIC;  Surgeon: Franky Macho, MD;  Location: [facility];  Service: General;  Laterality: N/A;    Social History   Socioeconomic History  . Marital status: Single    Spouse name: Not on file  . Number of children: Not on file  . Years of education: Not on file  . Highest education level: Not on file  Occupational History  . Not on file  Tobacco Use  . Smoking status: Former Smoker    Packs/day: 0.25    Years: 1.00    Pack years: 0.25    Types: Cigarettes    Start date: 03/14/2008    Quit date: 05/14/2009    Years since quitting: 10.2  . Smokeless tobacco: Never Used  Substance and Sexual Activity  . Alcohol use: Yes    Alcohol/week: 0.0 standard drinks    Comment: ocassional  . Drug use: No  . Sexual activity: Not Currently    Partners: Male    Birth control/protection: Condom  Other Topics Concern  . Not on file  Social History Narrative  . Not on file   Social Determinants of Health   Financial Resource Strain:   . Difficulty of Paying Living Expenses: Not on file  Food Insecurity:   . Worried About Programme researcher, broadcasting/film/video in the Last Year: Not on file  . Ran Out of Food in the Last Year: Not on file  Transportation Needs:   . Lack of Transportation (Medical): Not on file  . Lack of Transportation (Non-Medical):  Not on file  Physical Activity:   . Days of Exercise per Week: Not on file  . Minutes of Exercise per Session: Not on file  Stress:   . Feeling of Stress : Not on file  Social Connections:   . Frequency of Communication with Friends and Family: Not on file  . Frequency of Social Gatherings with Friends and Family: Not on file  . Attends Religious Services: Not on file  . Active Member of Clubs or Organizations: Not on file  . Attends Banker Meetings: Not on file  . Marital Status: Not on file    Family History  Problem Relation Age of Onset  . Diabetes Father     Outpatient Encounter Medications as of 07/26/2019  Medication Sig  . acetaminophen (TYLENOL) 500 MG tablet Take 500 mg by mouth every 6 (six) hours as needed  for mild pain or moderate pain.  Marland Kitchen albuterol (VENTOLIN HFA) 108 (90 Base) MCG/ACT inhaler INHALE TWO PUFFS INTO THE LUNGS EVERY 4 HOURS AS NEEDED (Patient not taking: Reported on 07/18/2019)  . ALPRAZolam (XANAX) 0.5 MG tablet Take 0.5 mg by mouth at bedtime as needed for anxiety.  . Cholecalciferol (VITAMIN D3) 125 MCG (5000 UT) CAPS Take 1 capsule (5,000 Units total) by mouth daily.  Marland Kitchen escitalopram (LEXAPRO) 10 MG tablet Take 10 mg by mouth daily.  . fenofibrate (TRICOR) 48 MG tablet Take 1 tablet by mouth once daily  . glipiZIDE (GLUCOTROL XL) 5 MG 24 hr tablet Take 1 tablet by mouth once daily with breakfast  . hydroxypropyl methylcellulose / hypromellose (ISOPTO TEARS / GONIOVISC) 2.5 % ophthalmic solution Place 1 drop into both eyes 3 (three) times daily as needed for dry eyes.  Marland Kitchen ibuprofen (ADVIL) 800 MG tablet Take 1 tablet (800 mg total) by mouth every 8 (eight) hours as needed.  . insulin NPH-regular Human (NOVOLIN 70/30 RELION) (70-30) 100 UNIT/ML injection Inject 30 Units into the skin 2 (two) times daily with a meal.  . Insulin Syringe-Needle U-100 (RELION INSULIN SYR 0.3CC/30G) 30G X 5/16" 0.3 ML MISC Use to inject insulin 2 x a day  . Multiple  Vitamin (MULTIVITAMIN WITH MINERALS) TABS tablet Take 1 tablet by mouth daily.  . [DISCONTINUED] Etonogestrel (IMPLANON [place]) Inject into the skin once.  . [DISCONTINUED] Fructose-Dextrose-Phosphor Acd (SB ANTI-NAUSEA PO) Take 1 capsule by mouth once as needed ([facility] BRAND-OTC).  . [DISCONTINUED] traMADol (ULTRAM) 50 MG tablet Take 1 tablet (50 mg total) by mouth every 6 (six) hours as needed for moderate pain (if tylenol not effective). (Patient not taking: Reported on 07/18/2019)   No facility-administered encounter medications on file as of 07/26/2019.    ALLERGIES: Allergies  Allergen Reactions  . Metformin And Related     Sensitivity to med; stomach issues    VACCINATION STATUS: Immunization History  Administered Date(s) Administered  . PPD Test 10/17/2017    Diabetes She presents for her follow-up diabetic visit. She has type 2 diabetes mellitus. Her disease course has been worsening. There are no hypoglycemic associated symptoms. Associated symptoms include polydipsia and polyuria. There are no hypoglycemic complications. Symptoms are worsening. There are no diabetic complications. Risk factors for coronary artery disease include diabetes mellitus. Current diabetic treatment includes insulin injections and oral agent (monotherapy). She is compliant with treatment none of the time. Her weight is increasing steadily. She is following a generally unhealthy diet. When asked about meal planning, she reported none. She has not had a previous visit with a dietitian. She participates in exercise intermittently. (She comes in without any logs nor meter.  Her point-of-care A1c is 10.3%, increasing from 9.9%.    In October 2019,  she was admitted with diabetes ketoacidosis, was discharged on insulin Novolin 70/30.  Her dose was adjusted to 30 units twice daily along with glipizide 5 mg p.o. daily at breakfast.   ) An ACE inhibitor/angiotensin II receptor blocker is not being taken.      Review of systems  Constitutional: + Minimally fluctuating body weight,  current  Body mass index is 35.94 kg/m. , no fatigue, no subjective hyperthermia, no subjective hypothermia Eyes: no blurry vision, no xerophthalmia ENT: no sore throat, no nodules palpated in throat, no dysphagia/odynophagia, no hoarseness Cardiovascular: no Chest Pain, no Shortness of Breath, no palpitations, no leg swelling Respiratory: no cough, no shortness of breath Gastrointestinal: no Nausea/Vomiting/Diarhhea Musculoskeletal: no muscle/joint  aches Skin: no rashes Neurological: no tremors, no numbness, no tingling, no dizziness Psychiatric: no depression, no anxiety   Objective:    BP (!) 145/93   Pulse 96   Ht 5\' 1"  (1.549 m)   Wt 190 lb 3.2 oz (86.3 kg)   BMI 35.94 kg/m   Wt Readings from Last 3 Encounters:  07/26/19 190 lb 3.2 oz (86.3 kg)  07/18/19 188 lb (85.3 kg)  10/03/18 165 lb (74.8 kg)    Physical Exam- Limited  Constitutional:  Body mass index is 35.94 kg/m. , not in acute distress, + reluctant affect.   Eyes:  EOMI, no exophthalmos Neck: Supple Respiratory: Adequate breathing efforts Musculoskeletal: no gross deformities, strength intact in all four extremities, no gross restriction of joint movements Skin:  no rashes, no hyperemia Neurological: no tremor with outstretched hands.     CMP ( most recent) CMP     Component Value Date/Time   NA 135 04/02/2019 1042   K 3.9 04/02/2019 1042   CL 105 04/02/2019 1042   CO2 21 (L) 04/02/2019 1042   GLUCOSE 177 (H) 04/02/2019 1042   BUN 11 04/02/2019 1042   CREATININE 0.45 04/02/2019 1042   CALCIUM 8.5 (L) 04/02/2019 1042   PROT 7.5 04/02/2019 1042   ALBUMIN 3.7 04/02/2019 1042   AST 43 (H) 04/02/2019 1042   ALT 44 04/02/2019 1042   ALKPHOS 69 04/02/2019 1042   BILITOT 0.4 04/02/2019 1042   GFRNONAA >60 04/02/2019 1042   GFRAA >60 04/02/2019 1042     Diabetic Labs (most recent): Lab Results  Component Value Date    HGBA1C 10.3 (A) 07/26/2019   HGBA1C 9.9 (H) 04/02/2019   HGBA1C 7.9 (A) 08/14/2018     Lipid Panel ( most recent) Lipid Panel     Component Value Date/Time   CHOL 234 (H) 04/02/2019 1042   TRIG 413 (H) 04/02/2019 1042   HDL 30 (L) 04/02/2019 1042   CHOLHDL 7.8 04/02/2019 1042   VLDL UNABLE TO CALCULATE IF TRIGLYCERIDE OVER 400 mg/dL 61/60/7371 0626   LDLCALC UNABLE TO CALCULATE IF TRIGLYCERIDE OVER 400 mg/dL 94/85/4627 0350   LDLDIRECT 133.0 (H) 04/02/2019 1042       Assessment & Plan:   1.  Uncontrolled type 2 diabetes Extremely high risk - Elain L Sharpe has currently uncontrolled symptomatic type 2 DM since 29 years of age. She returns without any logs nor meter.  Point-of-care A1c is 10.3% increasing from 9.9%.    She has a history of diabetic ketoacidosis which required hospitalization in October 2019.  She remains unengaged.  Her compliance is questionable at this point.  Her recent labs are reviewed with her.   -Once again, I had a long discussion with  her about the progressive nature of diabetes and the pathology behind its complications. -Her care is complicated by noncompliance/nonadherence , she does not report gross complications from diabetes yet, however she remains at a high risk for more acute and chronic complications which include CAD, CVA, CKD, retinopathy, and neuropathy. These are all discussed in detail with her.  - I have counseled her on diet management by adopting a carbohydrate restricted/protein rich diet.  - she  admits there is a room for improvement in her diet and drink choices. -  Suggestion is made for her to avoid simple carbohydrates  from her diet including Cakes, Sweet Desserts / Pastries, Ice Cream, Soda (diet and regular), Sweet Tea, Candies, Chips, Cookies, Sweet Pastries,  Store Bought Juices, Alcohol in  Excess of  1-2 drinks a day, Artificial Sweeteners, Coffee Creamer, and "Sugar-free" Products. This will help patient to  have stable blood glucose profile and potentially avoid unintended weight gain.  - I encouraged her to switch to  unprocessed or minimally processed complex starch and increased protein intake (animal or plant source), fruits, and vegetables.  - she is advised to stick to a routine mealtimes to eat 3 meals  a day and avoid unnecessary snacks ( to snack only to correct hypoglycemia).   - she missed her appointment with CDE, will  Re-scheduled with Norm Salt, RDN, CDE for individualized diabetes education.  - I have approached her with the following individualized plan to manage diabetes and patient agrees:   -The patient may need intensive treatment with basal/bolus insulin in order for her to achieve control of diabetes to target, however patient is concerned about cost of medications.   -Besides, she will not execute 4 shots of insulin daily as evident from her struggle to comply with twice daily injection of premixed insulin.    -I approached her to start monitoring blood glucose 4 times a day-daily before meals and at bedtime and return in 10 days with her meter and logs. -In the meantime, she is advised to continue Novolin 70/30  30 units with breakfast and 30 units with supper when pre-meal blood glucose readings are above 90 mg/dL.    - she is warned not to take insulin without proper monitoring per orders.  - she is encouraged to call clinic for blood glucose levels less than 70 or above 300 mg /dl. - she is advised to continue glipizide 5 mg  XL p.o. daily with her breakfast.   -She does not tolerate metformin, causing significant GI intolerance. - she will be considered for incretin therapy as appropriate next visit.   2) Blood Pressure /Hypertension: Her blood pressure is noted to be above target this morning.  She is not  not taking any antihypertensive medications for now.  She will be considered for intervention if it remains above 130/80 next visit.  3)  Lipids/Hyperlipidemia: She has significant dyslipidemia with hypertriglyceridemia 413, recently initiated on fenofibrate.   She is advised to continue fenofibrate 48 mg p.o. daily,  will be considered for statin later.   4) vitamin D deficiency: She he is on ongoing treatment with vitamin D3 5000 units daily x90 days.  - she is  advised to maintain close follow up with [name], MD for primary care needs, as well as her other providers for optimal and coordinated care.  - Time spent on this patient care encounter:  35 min, of which > 50% was spent in  counseling and the rest reviewing her blood glucose logs , discussing her hypoglycemia and hyperglycemia episodes, reviewing her current and  previous labs / studies  ( including abstraction from other facilities) and medications  doses and developing a  long term treatment plan and documenting her care.   Please refer to Patient Instructions for Blood Glucose Monitoring and Insulin/Medications Dosing Guide"  in media tab for additional information. Please  also refer to " Patient Self Inventory" in the Media  tab for reviewed elements of pertinent patient history.  Chauncy Lean Soderberg participated in the discussions, expressed understanding, and voiced agreement with the above plans.  All questions were answered to her satisfaction. she is encouraged to contact clinic should she have any questions or concerns prior to her return visit.   Follow up  plan: - Return in about 10 days (around 08/05/2019), or office, for Follow up with Meter and Logs Only - no Labs.  Marquis Lunch, MD [facility] [address] [place], [place] [address] Phone: [phone]  Fax: [phone]    07/26/2019, 1:14 PM  This note was partially dictated with voice recognition software. Similar sounding words can be transcribed inadequately or may not  be corrected upon review.

## 2019-07-26 NOTE — Patient Instructions (Signed)

## 2019-08-07 ENCOUNTER — Ambulatory Visit (INDEPENDENT_AMBULATORY_CARE_PROVIDER_SITE_OTHER): Payer: BLUE CROSS/BLUE SHIELD | Admitting: "Endocrinology

## 2019-08-07 ENCOUNTER — Other Ambulatory Visit: Payer: Self-pay

## 2019-08-07 ENCOUNTER — Encounter: Payer: Self-pay | Admitting: "Endocrinology

## 2019-08-07 VITALS — BP 142/89 | HR 93 | Ht 61.0 in | Wt 192.0 lb

## 2019-08-07 DIAGNOSIS — E782 Mixed hyperlipidemia: Secondary | ICD-10-CM | POA: Diagnosis not present

## 2019-08-07 DIAGNOSIS — E559 Vitamin D deficiency, unspecified: Secondary | ICD-10-CM | POA: Diagnosis not present

## 2019-08-07 DIAGNOSIS — E1165 Type 2 diabetes mellitus with hyperglycemia: Secondary | ICD-10-CM | POA: Diagnosis not present

## 2019-08-07 MED ORDER — GLUCOSE BLOOD VI STRP: ORAL_STRIP | 12 refills | Status: DC

## 2019-08-07 MED ORDER — FREESTYLE LIBRE 14 DAY READER DEVI: 1.0000 | Freq: Once | 0 refills | Status: AC

## 2019-08-07 MED ORDER — FREESTYLE LIBRE 14 DAY SENSOR MISC: 1.0000 | 2 refills | Status: DC

## 2019-08-07 MED ORDER — NOVOLIN 70/30 RELION (70-30) 100 UNIT/ML ~~LOC~~ SUSP: 36.0000 [IU] | Freq: Two times a day (BID) | SUBCUTANEOUS | 2 refills | Status: DC

## 2019-08-07 NOTE — Progress Notes (Signed)
08/07/2019, 5:30 PM   Endocrinology follow-up note    Subjective:    Patient ID: Ann Richards, female    DOB: 1990-07-18.  Ann Richards is being seen in follow-up for management of currently uncontrolled symptomatic diabetes requested by  [name], MD.   Past Medical History:  Diagnosis Date  . Anxiety   . Depression   . Diabetes mellitus without complication (HCC)   . Insomnia     Past Surgical History:  Procedure Laterality Date  . LAPAROSCOPIC APPENDECTOMY N/A 08/25/2016   Procedure: APPENDECTOMY LAPAROSCOPIC;  Surgeon: Franky Macho, MD;  Location: [facility];  Service: General;  Laterality: N/A;    Social History   Socioeconomic History  . Marital status: Single    Spouse name: Not on file  . Number of children: Not on file  . Years of education: Not on file  . Highest education level: Not on file  Occupational History  . Not on file  Tobacco Use  . Smoking status: Former Smoker    Packs/day: 0.25    Years: 1.00    Pack years: 0.25    Types: Cigarettes    Start date: 03/14/2008    Quit date: 05/14/2009    Years since quitting: 10.2  . Smokeless tobacco: Never Used  Substance and Sexual Activity  . Alcohol use: Yes    Alcohol/week: 0.0 standard drinks    Comment: ocassional  . Drug use: No  . Sexual activity: Not Currently    Partners: Male    Birth control/protection: Condom  Other Topics Concern  . Not on file  Social History Narrative  . Not on file   Social Determinants of Health   Financial Resource Strain:   . Difficulty of Paying Living Expenses: Not on file  Food Insecurity:   . Worried About Programme researcher, broadcasting/film/video in the Last Year: Not on file  . Ran Out of Food in the Last Year: Not on file  Transportation Needs:   . Lack of Transportation (Medical): Not on file  . Lack of Transportation (Non-Medical):  Not on file  Physical Activity:   . Days of Exercise per Week: Not on file  . Minutes of Exercise per Session: Not on file  Stress:   . Feeling of Stress : Not on file  Social Connections:   . Frequency of Communication with Friends and Family: Not on file  . Frequency of Social Gatherings with Friends and Family: Not on file  . Attends Religious Services: Not on file  . Active Member of Clubs or Organizations: Not on file  . Attends Banker Meetings: Not on file  . Marital Status: Not on file    Family History  Problem Relation Age of Onset  . Diabetes Father     Outpatient Encounter Medications as of 08/07/2019  Medication Sig  . albuterol (VENTOLIN HFA) 108 (90 Base) MCG/ACT inhaler INHALE TWO PUFFS INTO THE LUNGS EVERY 4  HOURS AS NEEDED  . APPLE CIDER VINEGAR PO Take by mouth.  . ALPRAZolam (XANAX) 0.5 MG tablet Take 0.5 mg by mouth at bedtime as needed for anxiety.  . Cholecalciferol (VITAMIN D3) 125 MCG (5000 UT) CAPS Take 1 capsule (5,000 Units total) by mouth daily.  . Continuous Blood Gluc Receiver (FREESTYLE LIBRE 14 DAY READER) DEVI 1 each by Does not apply route once for 1 dose.  . Continuous Blood Gluc Sensor (FREESTYLE LIBRE 14 DAY SENSOR) MISC Inject 1 each into the skin every 14 (fourteen) days. Use as directed.  . doxepin (SINEQUAN) 10 MG capsule Take 10 mg by mouth at bedtime.  Marland Kitchen escitalopram (LEXAPRO) 10 MG tablet Take 10 mg by mouth daily.  . fenofibrate (TRICOR) 48 MG tablet Take 1 tablet by mouth once daily  . glipiZIDE (GLUCOTROL XL) 5 MG 24 hr tablet Take 1 tablet by mouth once daily with breakfast  . glucose blood test strip Use as instructed  . hydroxypropyl methylcellulose / hypromellose (ISOPTO TEARS / GONIOVISC) 2.5 % ophthalmic solution Place 1 drop into both eyes 3 (three) times daily as needed for dry eyes.  Marland Kitchen ibuprofen (ADVIL) 800 MG tablet Take 1 tablet (800 mg total) by mouth every 8 (eight) hours as needed.  . insulin NPH-regular  Human (NOVOLIN 70/30 RELION) (70-30) 100 UNIT/ML injection Inject 36 Units into the skin 2 (two) times daily with a meal.  . Insulin Syringe-Needle U-100 (RELION INSULIN SYR 0.3CC/30G) 30G X 5/16" 0.3 ML MISC Use to inject insulin 2 x a day  . Multiple Vitamin (MULTIVITAMIN WITH MINERALS) TABS tablet Take 1 tablet by mouth daily.  . [DISCONTINUED] acetaminophen (TYLENOL) 500 MG tablet Take 500 mg by mouth every 6 (six) hours as needed for mild pain or moderate pain.  . [DISCONTINUED] insulin NPH-regular Human (NOVOLIN 70/30 RELION) (70-30) 100 UNIT/ML injection Inject 30 Units into the skin 2 (two) times daily with a meal.   No facility-administered encounter medications on file as of 08/07/2019.    ALLERGIES: Allergies  Allergen Reactions  . Metformin And Related     Sensitivity to med; stomach issues    VACCINATION STATUS: Immunization History  Administered Date(s) Administered  . PPD Test 10/17/2017    Diabetes She presents for her follow-up diabetic visit. She has type 2 diabetes mellitus. Her disease course has been improving. There are no hypoglycemic associated symptoms. Associated symptoms include polydipsia and polyuria. There are no hypoglycemic complications. Symptoms are improving. There are no diabetic complications. Risk factors for coronary artery disease include diabetes mellitus. Current diabetic treatment includes insulin injections and oral agent (monotherapy). She is compliant with treatment none of the time. Her weight is increasing steadily. She is following a generally unhealthy diet. When asked about meal planning, she reported none. She has not had a previous visit with a dietitian. She participates in exercise intermittently. Her home blood glucose trend is decreasing steadily. (She presents with significantly improved glycemic profile: Fasting between 136-194, prelunch between 131-285, presupper between 127-, bedtime between 143-330.    Recently, her point-of-care  A1c was 10.3%, increasing from 9.9%.  ) An ACE inhibitor/angiotensin II receptor blocker is not being taken.     Review of systems  Constitutional: + Minimally fluctuating body weight,  current  Body mass index is 36.28 kg/m. , no fatigue, no subjective hyperthermia, no subjective hypothermia Eyes: no blurry vision, no xerophthalmia ENT: no sore throat, no nodules palpated in throat, no dysphagia/odynophagia, no hoarseness Cardiovascular: no Chest Pain, no Shortness  of Breath, no palpitations, no leg swelling Respiratory: no cough, no shortness of breath Gastrointestinal: no Nausea/Vomiting/Diarhhea Musculoskeletal: no muscle/joint aches Skin: no rashes Neurological: no tremors, no numbness, no tingling, no dizziness Psychiatric: no depression, no anxiety   Objective:    BP (!) 142/89   Pulse 93   Ht 5\' 1"  (1.549 m)   Wt 192 lb (87.1 kg)   BMI 36.28 kg/m   Wt Readings from Last 3 Encounters:  08/07/19 192 lb (87.1 kg)  07/26/19 190 lb 3.2 oz (86.3 kg)  07/18/19 188 lb (85.3 kg)    Physical Exam- Limited   Physical Exam- Limited  Constitutional:  Body mass index is 36.28 kg/m. , not in acute distress, normal state of mind Eyes:  EOMI, no exophthalmos Neck: Supple Thyroid: No gross goiter Respiratory: Adequate breathing efforts Musculoskeletal: no gross deformities, strength intact in all four extremities, no gross restriction of joint movements Skin:  no rashes, no hyperemia Neurological: no tremor with outstretched hands,     CMP ( most recent) CMP     Component Value Date/Time   NA 135 04/02/2019 1042   K 3.9 04/02/2019 1042   CL 105 04/02/2019 1042   CO2 21 (L) 04/02/2019 1042   GLUCOSE 177 (H) 04/02/2019 1042   BUN 11 04/02/2019 1042   CREATININE 0.45 04/02/2019 1042   CALCIUM 8.5 (L) 04/02/2019 1042   PROT 7.5 04/02/2019 1042   ALBUMIN 3.7 04/02/2019 1042   AST 43 (H) 04/02/2019 1042   ALT 44 04/02/2019 1042   ALKPHOS 69 04/02/2019 1042   BILITOT  0.4 04/02/2019 1042   GFRNONAA >60 04/02/2019 1042   GFRAA >60 04/02/2019 1042     Diabetic Labs (most recent): Lab Results  Component Value Date   HGBA1C 10.3 (A) 07/26/2019   HGBA1C 9.9 (H) 04/02/2019   HGBA1C 7.9 (A) 08/14/2018     Lipid Panel ( most recent) Lipid Panel     Component Value Date/Time   CHOL 234 (H) 04/02/2019 1042   TRIG 413 (H) 04/02/2019 1042   HDL 30 (L) 04/02/2019 1042   CHOLHDL 7.8 04/02/2019 1042   VLDL UNABLE TO CALCULATE IF TRIGLYCERIDE OVER 400 mg/dL 04/04/2019 62/37/6283   LDLCALC UNABLE TO CALCULATE IF TRIGLYCERIDE OVER 400 mg/dL 1517 61/60/7371   LDLDIRECT 133.0 (H) 04/02/2019 1042       Assessment & Plan:   1.  Uncontrolled type 2 diabetes Extremely high risk - Ann Richards has currently uncontrolled symptomatic type 2 DM since 29 years of age.  She presents with significantly improved glycemic profile: Fasting between 136-194, prelunch between 131-285, presupper between 127-, bedtime between 143-330.    Recently, her point-of-care A1c was 10.3%, increasing from 9.9%.   She has a history of diabetic ketoacidosis which required hospitalization in October 2019.  She remains unengaged.  Her compliance is questionable at this point.  Her recent labs are reviewed with her.   -Once again, I had a long discussion with  her about the progressive nature of diabetes and the pathology behind its complications. -Her care is complicated by noncompliance/nonadherence , she does not report gross complications from diabetes yet, however she remains at a high risk for more acute and chronic complications which include CAD, CVA, CKD, retinopathy, and neuropathy. These are all discussed in detail with her.  - I have counseled her on diet management by adopting a carbohydrate restricted/protein rich diet.  - she  admits there is a room for improvement in her diet and  drink choices. -  Suggestion is made for her to avoid simple carbohydrates  from her  diet including Cakes, Sweet Desserts / Pastries, Ice Cream, Soda (diet and regular), Sweet Tea, Candies, Chips, Cookies, Sweet Pastries,  Store Bought Juices, Alcohol in Excess of  1-2 drinks a day, Artificial Sweeteners, Coffee Creamer, and "Sugar-free" Products. This will help patient to have stable blood glucose profile and potentially avoid unintended weight gain.   - I encouraged her to switch to  unprocessed or minimally processed complex starch and increased protein intake (animal or plant source), fruits, and vegetables.  - she is advised to stick to a routine mealtimes to eat 3 meals  a day and avoid unnecessary snacks ( to snack only to correct hypoglycemia).   - she missed her appointment with CDE, will  Re-scheduled with Norm Salt, RDN, CDE for individualized diabetes education.  - I have approached her with the following individualized plan to manage diabetes and patient agrees:   -This patient may need intensive treatment with basal/bolus insulin in order for her to achieve control of diabetes to target, however patient is concerned about cost of medications.   -Besides, she will not execute 4 shots of insulin daily as evident from her struggle to comply with twice daily injection of premixed insulin.    -I discussed and advised her to increase her Novolin 70/30 to 36 units twice daily with breakfast and with supper for premeal blood glucose readings above 90 mg per DL, associated with monitoring of blood glucose 4 times a day-before meals and at bedtime.  -This patient will benefit from a CGM device.  I discussed and prescribed the freestyle libre device for her. - she is warned not to take insulin without proper monitoring per orders.  - she is encouraged to call clinic for blood glucose levels less than 70 or above 300 mg /dl. -She did not tolerate Metformin causing significant GI intolerance.  She is advised to continue low-dose glipizide 5 mg XL p.o. daily with breakfast.     - she will be considered for incretin therapy as appropriate next visit.   2) Blood Pressure /Hypertension: Her blood pressure is not controlled to target.   She is not  not taking any antihypertensive medications for now.  She will be considered for intervention if it remains above 130/80 next visit.  3) Lipids/Hyperlipidemia: She has significant dyslipidemia with hypertriglyceridemia 413, recently initiated on fenofibrate.   She is advised to continue fenofibrate 48 mg p.o. daily,  will be considered for statin later.   4) vitamin D deficiency: She he is on ongoing treatment with vitamin D3 5000 units daily x90 days.  - she is  advised to maintain close follow up with [name], MD for primary care needs, as well as her other providers for optimal and coordinated care.  - Time spent on this patient care encounter:  35 min, of which > 50% was spent in  counseling and the rest reviewing her blood glucose logs , discussing her hypoglycemia and hyperglycemia episodes, reviewing her current and  previous labs / studies  ( including abstraction from other facilities) and medications  doses and developing a  long term treatment plan and documenting her care.   Please refer to Patient Instructions for Blood Glucose Monitoring and Insulin/Medications Dosing Guide"  in media tab for additional information. Please  also refer to " Patient Self Inventory" in the Media  tab for reviewed elements of pertinent patient history.  Ann Richards participated in the discussions, expressed understanding, and voiced agreement with the above plans.  All questions were answered to her satisfaction. she is encouraged to contact clinic should she have any questions or concerns prior to her return visit.    Follow up plan: - Return in about 3 months (around 11/04/2019) for Bring Meter and Logs- A1c in Office, Follow up with Pre-visit Labs.  Glade Lloyd, MD [facility] [address] [place], [place] [address] Phone: [phone]  Fax: [phone]    08/07/2019, 5:30 PM  This note was partially dictated with voice recognition software. Similar sounding words can be transcribed inadequately or may not  be corrected upon review.

## 2019-08-07 NOTE — Patient Instructions (Signed)

## 2019-08-08 ENCOUNTER — Ambulatory Visit: Payer: BLUE CROSS/BLUE SHIELD | Admitting: "Endocrinology

## 2019-09-12 ENCOUNTER — Encounter: Payer: BLUE CROSS/BLUE SHIELD | Attending: "Endocrinology | Admitting: Nutrition

## 2019-09-12 ENCOUNTER — Other Ambulatory Visit: Payer: Self-pay

## 2019-09-12 ENCOUNTER — Encounter: Payer: Self-pay | Admitting: Nutrition

## 2019-09-12 VITALS — Ht 61.0 in | Wt 191.6 lb

## 2019-09-12 DIAGNOSIS — E782 Mixed hyperlipidemia: Secondary | ICD-10-CM

## 2019-09-12 DIAGNOSIS — E1165 Type 2 diabetes mellitus with hyperglycemia: Secondary | ICD-10-CM | POA: Insufficient documentation

## 2019-09-12 DIAGNOSIS — F320 Major depressive disorder, single episode, mild: Secondary | ICD-10-CM | POA: Insufficient documentation

## 2019-09-12 NOTE — Progress Notes (Signed)
  Medical Nutrition Therapy:  Appt start time: 0800 end time:  0915  Assessment:  Primary concerns today: Diabetes Type 2. Lives with her Mom. Dx in 2017. See's Dr. Fransico Him, Endocrinology and PCP is Dr. Gerda Diss. Eating 2-3 meals per day. Tends to skip lunch Physical activity: walking Works as Lawyer.Marland Kitchen   70/30 36 units twice a day. , Glipizide. Ordered libre. FBS;  200-160's   Before supper 131-150's  Testing 2-3 times per day.  Preferred Learning Style:  No preference indicated   Learning Readiness:   Ready  Change in progress   MEDICATIONS:   DIETARY INTAKE:  24-hr recall:  B ( AM):  1 packet, Oatmeal with butter and almond milk, bacon or sausage or egg occassionally. Snk ( AM):  L ( PM):  Skipped:  Sandwich [place] on honey wheat or white bread, cheese its, Diet Soda or water Snk ( PM):  D ( PM): Chicken alfredo, zero gingerale Snk ( PM):  Beverages: Diet sodas, water  Usual physical activity: walks some with  Her mom.  Estimated energy needs: 1200 calories 135 g carbohydrates 90 g protein 33 g fat  Progress Towards Goal(s):  In progress.   Nutritional Diagnosis:  NB-1.1 Food and nutrition-related knowledge deficit As related to Diabetes Type 2.  As evidenced by A1C 10.3%.    Intervention:  Nutrition and Diabetes education provided on My Plate, CHO counting, meal planning, portion sizes, timing of meals, avoiding snacks between meals unless having a low blood sugar, target ranges for A1C and blood sugars, signs/symptoms and treatment of hyper/hypoglycemia, monitoring blood sugars, taking medications as prescribed, benefits of exercising 30 minutes per day and prevention of complications of DM.  Marland KitchenGoals Eat three meals per day Decrease and cut out diet sodas Increase walking  Lose 5 lbs by next visit. Get A1C down to 7%. May talk to Dr. Fransico Him about insulin pen instead of vial and syringe now that you have insurance.  Teaching Method Utilized:  Visual Auditory Hands  on  Handouts given during visit include:  The Plate Method   Meal Plan Card  Diabetes Instructions   Barriers to learning/adherence to lifestyle change: none  Demonstrated degree of understanding via:  Teach Back   Monitoring/Evaluation:  Dietary intake, exercise, , and body weight in 2 month(s). Now that she has insurance, consider insulin pen instead of vial and syringe.

## 2019-09-12 NOTE — Patient Instructions (Addendum)
Goals Eat three meals per day Decrease and cut out diet sodas Increase walking  Lose 5 lbs by next visit. Get A1C down to 7%. May talk to Dr. Fransico Him about insulin pen instead of vial and syringe now that you have insurance.

## 2019-09-16 ENCOUNTER — Other Ambulatory Visit: Payer: Self-pay

## 2019-09-16 MED ORDER — FENOFIBRATE 48 MG PO TABS: 48.0000 mg | ORAL_TABLET | Freq: Every day | ORAL | 0 refills | Status: DC

## 2019-09-18 ENCOUNTER — Other Ambulatory Visit: Payer: Self-pay

## 2019-09-18 DIAGNOSIS — E1165 Type 2 diabetes mellitus with hyperglycemia: Secondary | ICD-10-CM

## 2019-09-18 MED ORDER — GLIPIZIDE ER 5 MG PO TB24: ORAL_TABLET | ORAL | 0 refills | Status: DC

## 2019-10-24 ENCOUNTER — Other Ambulatory Visit: Payer: Self-pay

## 2019-10-24 MED ORDER — "INSULIN SYRINGE-NEEDLE U-100 30G X 5/16"" 0.3 ML MISC": 2 refills | Status: DC

## 2019-10-28 ENCOUNTER — Other Ambulatory Visit: Payer: Self-pay

## 2019-10-28 DIAGNOSIS — E1165 Type 2 diabetes mellitus with hyperglycemia: Secondary | ICD-10-CM

## 2019-10-28 MED ORDER — NOVOLIN 70/30 RELION (70-30) 100 UNIT/ML ~~LOC~~ SUSP: 36.0000 [IU] | Freq: Two times a day (BID) | SUBCUTANEOUS | 0 refills | Status: DC

## 2019-11-07 ENCOUNTER — Ambulatory Visit: Payer: BLUE CROSS/BLUE SHIELD | Attending: Internal Medicine

## 2019-11-07 ENCOUNTER — Other Ambulatory Visit: Payer: Self-pay

## 2019-11-07 ENCOUNTER — Ambulatory Visit: Payer: BLUE CROSS/BLUE SHIELD | Admitting: "Endocrinology

## 2019-11-07 ENCOUNTER — Ambulatory Visit: Payer: BLUE CROSS/BLUE SHIELD | Admitting: Nutrition

## 2019-11-07 DIAGNOSIS — Z20822 Contact with and (suspected) exposure to covid-19: Secondary | ICD-10-CM

## 2019-11-08 ENCOUNTER — Telehealth: Payer: Self-pay | Admitting: *Deleted

## 2019-11-08 NOTE — Telephone Encounter (Signed)
Patient called for results ,still pending . 

## 2019-11-09 LAB — NOVEL CORONAVIRUS, NAA

## 2019-11-12 ENCOUNTER — Ambulatory Visit: Payer: BLUE CROSS/BLUE SHIELD | Attending: Internal Medicine

## 2019-11-12 ENCOUNTER — Other Ambulatory Visit: Payer: Self-pay

## 2019-11-12 DIAGNOSIS — Z20822 Contact with and (suspected) exposure to covid-19: Secondary | ICD-10-CM

## 2019-11-13 LAB — SARS-COV-2, NAA 2 DAY TAT

## 2019-11-13 LAB — NOVEL CORONAVIRUS, NAA: SARS-CoV-2, NAA: NOT DETECTED

## 2019-11-26 ENCOUNTER — Telehealth: Payer: Self-pay | Admitting: Advanced Practice Midwife

## 2019-11-26 NOTE — Telephone Encounter (Signed)
Telephoned patient at home number and mailbox was full.

## 2019-11-26 NOTE — Telephone Encounter (Signed)
Pt has an issue she needs to discuss with a nurse about yeast infection.

## 2019-11-27 ENCOUNTER — Telehealth: Payer: Self-pay | Admitting: Advanced Practice Midwife

## 2019-11-27 NOTE — Telephone Encounter (Signed)
Message taken in error

## 2019-11-27 NOTE — Telephone Encounter (Signed)
Patient returned call. Patient states sugars are running high and now has yeast infection. Discharge is white and is very itchy. Tried OTC but did not help. Would Rx for Diflucan. Patient unable to come into the office due to being in quarantine. Advised would send to provider. Patient voiced understanding.

## 2019-11-28 ENCOUNTER — Other Ambulatory Visit: Payer: Self-pay | Admitting: Advanced Practice Midwife

## 2019-11-28 MED ORDER — FLUCONAZOLE 150 MG PO TABS: ORAL_TABLET | ORAL | 1 refills | Status: DC

## 2019-11-28 MED ORDER — IBUPROFEN 800 MG PO TABS: 800.0000 mg | ORAL_TABLET | Freq: Three times a day (TID) | ORAL | 1 refills | Status: DC | PRN

## 2019-11-28 NOTE — Progress Notes (Signed)
Diflucan sent

## 2019-11-28 NOTE — Telephone Encounter (Signed)
Diflucan sent

## 2019-12-12 ENCOUNTER — Other Ambulatory Visit: Payer: Self-pay | Admitting: "Endocrinology

## 2019-12-12 DIAGNOSIS — E1165 Type 2 diabetes mellitus with hyperglycemia: Secondary | ICD-10-CM

## 2019-12-21 ENCOUNTER — Other Ambulatory Visit: Payer: Self-pay | Admitting: "Endocrinology

## 2019-12-21 DIAGNOSIS — E1165 Type 2 diabetes mellitus with hyperglycemia: Secondary | ICD-10-CM

## 2019-12-24 ENCOUNTER — Other Ambulatory Visit: Payer: Self-pay | Admitting: "Endocrinology

## 2019-12-25 ENCOUNTER — Telehealth: Payer: Self-pay | Admitting: "Endocrinology

## 2019-12-25 DIAGNOSIS — E1165 Type 2 diabetes mellitus with hyperglycemia: Secondary | ICD-10-CM

## 2019-12-25 NOTE — Telephone Encounter (Signed)
Patient has lost her medication bag. She needs new RX's called in- Glipizide, Fenofibrate and needs the BD PEN NEEDLE NANO 2ND GEN 32G X 4 MM MISC and the pen tops. [facility] [address] [place]

## 2019-12-26 ENCOUNTER — Other Ambulatory Visit: Payer: BLUE CROSS/BLUE SHIELD | Admitting: Advanced Practice Midwife

## 2019-12-30 MED ORDER — FENOFIBRATE 48 MG PO TABS: 48.0000 mg | ORAL_TABLET | Freq: Every day | ORAL | 0 refills | Status: DC

## 2019-12-30 MED ORDER — GLIPIZIDE ER 5 MG PO TB24: ORAL_TABLET | ORAL | 0 refills | Status: DC

## 2019-12-30 MED ORDER — BD PEN NEEDLE NANO 2ND GEN 32G X 4 MM MISC: 0 refills | Status: DC

## 2019-12-30 NOTE — Telephone Encounter (Signed)
[facility], also tried to call pt to schedule an appointment but did not receive an answer and was unable to leave a message.

## 2019-12-30 NOTE — Telephone Encounter (Signed)
Notified pt. She is scheduled.

## 2020-01-23 ENCOUNTER — Other Ambulatory Visit: Payer: Self-pay

## 2020-01-23 ENCOUNTER — Ambulatory Visit (INDEPENDENT_AMBULATORY_CARE_PROVIDER_SITE_OTHER): Payer: BLUE CROSS/BLUE SHIELD | Admitting: Advanced Practice Midwife

## 2020-01-23 ENCOUNTER — Other Ambulatory Visit: Payer: Self-pay | Admitting: "Endocrinology

## 2020-01-23 ENCOUNTER — Encounter: Payer: Self-pay | Admitting: Advanced Practice Midwife

## 2020-01-23 ENCOUNTER — Encounter: Payer: Self-pay | Admitting: "Endocrinology

## 2020-01-23 ENCOUNTER — Other Ambulatory Visit (HOSPITAL_COMMUNITY)
Admission: RE | Admit: 2020-01-23 | Discharge: 2020-01-23 | Disposition: A | Discharge status: Home / Self Care | Payer: BLUE CROSS/BLUE SHIELD | Source: Ambulatory Visit | Attending: Advanced Practice Midwife | Admitting: Advanced Practice Midwife

## 2020-01-23 VITALS — BP 157/87 | HR 83 | Ht 61.25 in | Wt 188.0 lb

## 2020-01-23 DIAGNOSIS — Z01419 Encounter for gynecological examination (general) (routine) without abnormal findings: Secondary | ICD-10-CM | POA: Diagnosis not present

## 2020-01-23 LAB — TSH: TSH: 2.07 (ref 0.41–5.90)

## 2020-01-23 LAB — LIPID PANEL
Cholesterol: 238 — AB (ref 0–200)
HDL: 32 — AB (ref 35–70)
LDL Cholesterol: 136
Triglycerides: 396 — AB (ref 40–160)

## 2020-01-23 LAB — BASIC METABOLIC PANEL
BUN: 21 (ref 4–21)
Creatinine: 0.7 (ref 0.5–1.1)

## 2020-01-23 MED ORDER — METRONIDAZOLE 500 MG PO TABS
500.0000 mg | ORAL_TABLET | Freq: Two times a day (BID) | ORAL | 0 refills | Status: DC
Start: 2020-01-23 — End: 2020-04-21

## 2020-01-23 NOTE — Progress Notes (Signed)
Ann Richards 28 y.o.  Vitals:   01/23/20 0844  BP: (!) 157/87  Pulse: 83     Filed Weights   01/23/20 0844  Weight: 188 lb (85.3 kg)    Past Medical History: Past Medical History:  Diagnosis Date  . Anxiety   . Depression   . Diabetes mellitus without complication (HCC)   . Insomnia     Past Surgical History: Past Surgical History:  Procedure Laterality Date  . LAPAROSCOPIC APPENDECTOMY N/A 08/25/2016   Procedure: APPENDECTOMY LAPAROSCOPIC;  Surgeon: Franky Macho, MD;  Location: [facility];  Service: General;  Laterality: N/A;    Family History: Family History  Problem Relation Age of Onset  . Diabetes Father     Social History: Social History   Tobacco Use  . Smoking status: Former Smoker    Packs/day: 0.25    Years: 1.00    Pack years: 0.25    Types: Cigarettes    Start date: 03/14/2008    Quit date: 05/14/2009    Years since quitting: 10.7  . Smokeless tobacco: Never Used  Vaping Use  . Vaping Use: Never used  Substance Use Topics  . Alcohol use: Yes    Alcohol/week: 0.0 standard drinks    Comment: ocassional  . Drug use: No    Allergies:  Allergies  Allergen Reactions  . Metformin And Related     Sensitivity to med; stomach issues      Current Outpatient Medications:  .  albuterol (VENTOLIN HFA) 108 (90 Base) MCG/ACT inhaler, INHALE TWO PUFFS INTO THE LUNGS EVERY 4 HOURS AS NEEDED, Disp: 18 each, Rfl: 3 .  ALPRAZolam (XANAX) 0.5 MG tablet, Take 0.5 mg by mouth at bedtime as needed for anxiety., Disp: , Rfl:  .  APPLE CIDER VINEGAR PO, Take by mouth., Disp: , Rfl:  .  Cholecalciferol (VITAMIN D3) 125 MCG (5000 UT) CAPS, Take 1 capsule (5,000 Units total) by mouth daily., Disp: 90 capsule, Rfl: 1 .  Continuous Blood Gluc Sensor (FREESTYLE LIBRE 14 DAY SENSOR) MISC, Inject 1 each into the skin every 14 (fourteen) days. Use as directed., Disp: 2 each, Rfl: 2 .  doxepin (SINEQUAN) 10 MG capsule, Take 10 mg by mouth at bedtime., Disp: , Rfl:   .  escitalopram (LEXAPRO) 10 MG tablet, Take 10 mg by mouth daily., Disp: , Rfl:  .  fenofibrate (TRICOR) 48 MG tablet, Take 1 tablet (48 mg total) by mouth daily., Disp: 30 tablet, Rfl: 0 .  glipiZIDE (GLUCOTROL XL) 5 MG 24 hr tablet, Take 1 tablet by mouth once daily with breakfast, Disp: 30 tablet, Rfl: 0 .  glucose blood test strip, Use as instructed, Disp: 100 each, Rfl: 12 .  hydroxypropyl methylcellulose / hypromellose (ISOPTO TEARS / GONIOVISC) 2.5 % ophthalmic solution, Place 1 drop into both eyes 3 (three) times daily as needed for dry eyes., Disp: , Rfl:  .  ibuprofen (ADVIL) 800 MG tablet, Take 1 tablet (800 mg total) by mouth every 8 (eight) hours as needed., Disp: 60 tablet, Rfl: 1 .  Insulin Pen Needle (BD PEN NEEDLE NANO 2ND GEN) 32G X 4 MM MISC, USE AS DIRECTED TWICE A DAY, Disp: 90 each, Rfl: 0 .  Insulin Syringe-Needle U-100 (RELION INSULIN SYR 0.3CC/30G) 30G X 5/16" 0.3 ML MISC, Use to inject insulin 2 x a day, Disp: 100 each, Rfl: 2 .  Multiple Vitamin (MULTIVITAMIN WITH MINERALS) TABS tablet, Take 1 tablet by mouth daily., Disp: , Rfl:  .  NOVOLIN 70/30  FLEXPEN (70-30) 100 UNIT/ML KwikPen, INJECT 36 UNITS INTO THE SKIN 2 (TWO) TIMES DAILY WITH A MEAL., Disp: 15 mL, Rfl: 0  History of Present Illness: here for pap.  Has never had one. Is  sexually active. Discussed Gardisil. Offered birth control and pt declined.  Condoms recommended. States that BP is only high at the MD office. Has home BP cuff.  Review of chart reveals probable HTN.   PCP watches labs. Has a psychiatrist but interested in a therapist.    Review of Systems   Patient denies any headaches, blurred vision, shortness of breath, chest pain, abdominal pain, problems with bowel movements, urination.  C/O discharge w/odor for a few days.    Physical Exam: General:  Well developed, well nourished, no acute distress Skin:  Warm and dry Neck:  Midline trachea, normal thyroid Lungs; Clear to auscultation  bilaterally Breast:  No dominant palpable mass, retraction, or nipple discharge Cardiovascular: Regular rate and rhythm Abdomen:  Soft, non tender, no hepatosplenomegaly Pelvic:  External genitalia is normal in appearance.  The vagina is red w/white discharge.  The cervix is smooth and non friable.  Uterus is felt to be normal size, shape, and contour.  No adnexal masses or tenderness noted. Exam limited by habitus Extremities:  No swelling or varicosities noted Psych:  No mood changes.     Impression: Normal GYN exam High PNQ score   Plan: if pap normal, repeat in 3 years.   [facility] discussed; pt to contact  See PCP about BP and HPV vaccine  Rx Flagyl for BV sx      '

## 2020-01-23 NOTE — Patient Instructions (Addendum)
HPV (Human Papillomavirus) Vaccine: What You Need to Know 1. Why get vaccinated? HPV (Human papillomavirus) vaccine can prevent infection with some types of human papillomavirus. HPV infections can cause certain types of cancers including:  cervical, vaginal and vulvar cancers in women,  penile cancer in men, and  anal cancers in both men and women. HPV vaccine prevents infection from the HPV types that cause over 90% of these cancers. HPV is spread through intimate skin-to-skin or sexual contact. HPV infections are so common that nearly all men and women will get at least one type of HPV at some time in their lives. Most HPV infections go away by themselves within 2 years. But sometimes HPV infections will last longer and can cause cancers later in life. 2. HPV vaccine HPV vaccine is routinely recommended for adolescents at 76 or 31 years of age to ensure they are protected before they are exposed to the virus. HPV vaccine may be given beginning at age 31 years, and as late as age 17 years. Most people older than 26 years will not benefit from HPV vaccination. Talk with your health care provider if you want more information. Most children who get the first dose before 46 years of age need 2 doses of HPV vaccine. Anyone who gets the first dose on or after 30 years of age, and younger people with certain immunocompromising conditions, need 3 doses. Your health care provider can give you more information. HPV vaccine may be given at the same time as other vaccines. 3. Talk with your health care provider Tell your vaccine provider if the person getting the vaccine:  Has had an allergic reaction after a previous dose of HPV vaccine, or has any severe, life-threatening allergies.  Is pregnant. In some cases, your health care provider may decide to postpone HPV vaccination to a future visit. People with minor illnesses, such as a cold, may be vaccinated. People who are moderately or severely ill  should usually wait until they recover before getting HPV vaccine. Your health care provider can give you more information. 4. Risks of a vaccine reaction  Soreness, redness, or swelling where the shot is given can happen after HPV vaccine.  Fever or headache can happen after HPV vaccine. People sometimes faint after medical procedures, including vaccination. Tell your provider if you feel dizzy or have vision changes or ringing in the ears. As with any medicine, there is a very remote chance of a vaccine causing a severe allergic reaction, other serious injury, or death. 5. What if there is a serious problem? An allergic reaction could occur after the vaccinated person leaves the clinic. If you see signs of a severe allergic reaction (hives, swelling of the face and throat, difficulty breathing, a fast heartbeat, dizziness, or weakness), call 9-1-1 and get the person to the nearest hospital. For other signs that concern you, call your health care provider. Adverse reactions should be reported to the Vaccine Adverse Event Reporting System (VAERS). Your health care provider will usually file this report, or you can do it yourself. Visit the VAERS website at www.vaers.SamedayNews.es or call 959-256-4543. VAERS is only for reporting reactions, and VAERS staff do not give medical advice. 6. The National Vaccine Injury Compensation Program The [employer] Vaccine Injury Compensation Program (VICP) is a federal program that was created to compensate people who may have been injured by certain vaccines. Visit the VICP website at GoldCloset.com.ee or call 973 792 1440 to learn about the program and about filing a claim. There  is a time limit to file a claim for compensation. 7. How can I learn more?  Ask your health care provider.  Call your local or state health department.  Contact the Centers for Disease Control and Prevention (CDC): ? Call 7266279363 (1-800-CDC-INFO) or ? Visit CDC's  website at PicCapture.uy Vaccine Information Statement HPV Vaccine (04/18/2018) This information is not intended to replace advice given to you by your health care provider. Make sure you discuss any questions you have with your health care provider. Document Revised: 09/25/2018 Document Reviewed: 01/16/2018 Elsevier Patient Education  2020 [employer].    [facility]

## 2020-01-24 ENCOUNTER — Encounter: Payer: Self-pay | Admitting: "Endocrinology

## 2020-01-24 ENCOUNTER — Ambulatory Visit (INDEPENDENT_AMBULATORY_CARE_PROVIDER_SITE_OTHER): Payer: BLUE CROSS/BLUE SHIELD | Admitting: "Endocrinology

## 2020-01-24 VITALS — BP 110/82 | HR 52 | Ht 61.0 in | Wt 187.8 lb

## 2020-01-24 DIAGNOSIS — E1165 Type 2 diabetes mellitus with hyperglycemia: Secondary | ICD-10-CM | POA: Diagnosis not present

## 2020-01-24 DIAGNOSIS — Z9119 Patient's noncompliance with other medical treatment and regimen: Secondary | ICD-10-CM

## 2020-01-24 DIAGNOSIS — E782 Mixed hyperlipidemia: Secondary | ICD-10-CM

## 2020-01-24 DIAGNOSIS — E66812 Obesity, class 2: Secondary | ICD-10-CM | POA: Insufficient documentation

## 2020-01-24 DIAGNOSIS — Z6835 Body mass index (BMI) 35.0-35.9, adult: Secondary | ICD-10-CM

## 2020-01-24 DIAGNOSIS — E559 Vitamin D deficiency, unspecified: Secondary | ICD-10-CM

## 2020-01-24 DIAGNOSIS — Z91199 Patient's noncompliance with other medical treatment and regimen due to unspecified reason: Secondary | ICD-10-CM

## 2020-01-24 LAB — MICROALBUMIN / CREATININE URINE RATIO
Creatinine, Urine: 73.4 mg/dL
Microalb/Creat Ratio: 5 mg/g creat (ref 0–29)
Microalbumin, Urine: 3.9 ug/mL

## 2020-01-24 LAB — COMPREHENSIVE METABOLIC PANEL
ALT: 18 IU/L (ref 0–32)
AST: 13 IU/L (ref 0–40)
Albumin/Globulin Ratio: 1.6 (ref 1.2–2.2)
Albumin: 4.4 g/dL (ref 3.9–5.0)
Alkaline Phosphatase: 89 IU/L (ref 48–121)
BUN/Creatinine Ratio: 21 (ref 9–23)
BUN: 15 mg/dL (ref 6–20)
Bilirubin Total: <0.2 mg/dL (ref 0.0–1.2)
CO2: 19 mmol/L — ABNORMAL LOW (ref 20–29)
Calcium: 9.6 mg/dL (ref 8.7–10.2)
Chloride: 99 mmol/L (ref 96–106)
Creatinine, Ser: 0.72 mg/dL (ref 0.57–1.00)
GFR calc Af Amer: 131 mL/min/{1.73_m2} (ref >59)
GFR calc non Af Amer: 114 mL/min/{1.73_m2} (ref >59)
Globulin, Total: 2.8 g/dL (ref 1.5–4.5)
Glucose: 243 mg/dL — ABNORMAL HIGH (ref 65–99)
Potassium: 4.2 mmol/L (ref 3.5–5.2)
Sodium: 135 mmol/L (ref 134–144)
Total Protein: 7.2 g/dL (ref 6.0–8.5)

## 2020-01-24 LAB — LIPID PANEL W/O CHOL/HDL RATIO
Cholesterol, Total: 238 mg/dL — ABNORMAL HIGH (ref 100–199)
HDL: 32 mg/dL — ABNORMAL LOW (ref >39)
LDL Chol Calc (NIH): 136 mg/dL — ABNORMAL HIGH (ref 0–99)
Triglycerides: 386 mg/dL — ABNORMAL HIGH (ref 0–149)
VLDL Cholesterol Cal: 70 mg/dL — ABNORMAL HIGH (ref 5–40)

## 2020-01-24 LAB — SPECIMEN STATUS REPORT

## 2020-01-24 LAB — T4, FREE: Free T4: 1.47 ng/dL (ref 0.82–1.77)

## 2020-01-24 LAB — POCT GLYCOSYLATED HEMOGLOBIN (HGB A1C): Hemoglobin A1C: 10.6 % — AB (ref 4.0–5.6)

## 2020-01-24 LAB — TSH: TSH: 2.07 u[IU]/mL (ref 0.450–4.500)

## 2020-01-24 MED ORDER — GLIPIZIDE ER 10 MG PO TB24: ORAL_TABLET | ORAL | 1 refills | Status: DC

## 2020-01-24 MED ORDER — NOVOLIN 70/30 FLEXPEN (70-30) 100 UNIT/ML ~~LOC~~ SUPN: PEN_INJECTOR | SUBCUTANEOUS | 0 refills | Status: DC

## 2020-01-24 MED ORDER — FENOFIBRATE 48 MG PO TABS: 48.0000 mg | ORAL_TABLET | Freq: Every day | ORAL | 1 refills | Status: DC

## 2020-01-24 MED ORDER — ROSUVASTATIN CALCIUM 10 MG PO TABS: 10.0000 mg | ORAL_TABLET | Freq: Every day | ORAL | 1 refills | Status: DC

## 2020-01-24 MED ORDER — GLUCOSE BLOOD VI STRP: ORAL_STRIP | 2 refills | Status: DC

## 2020-01-24 NOTE — Progress Notes (Signed)
01/24/2020, 1:38 PM   Endocrinology follow-up note    Subjective:    Patient ID: Ann Richards, female    DOB: Aug 25, 1990.  Ann Richards is being seen in follow-up for management of currently uncontrolled symptomatic diabetes requested by  Annalee Genta, DO.   Past Medical History:  Diagnosis Date   Anxiety    Depression    Diabetes mellitus without complication (HCC)    Insomnia     Past Surgical History:  Procedure Laterality Date   LAPAROSCOPIC APPENDECTOMY N/A 08/25/2016   Procedure: APPENDECTOMY LAPAROSCOPIC;  Surgeon: Franky Macho, MD;  Location: [facility];  Service: General;  Laterality: N/A;    Social History   Socioeconomic History   Marital status: Single    Spouse name: Not on file   Number of children: Not on file   Years of education: Not on file   Highest education level: Not on file  Occupational History   Not on file  Tobacco Use   Smoking status: Former Smoker    Packs/day: 0.25    Years: 1.00    Pack years: 0.25    Types: Cigarettes    Start date: 03/14/2008    Quit date: 05/14/2009    Years since quitting: 10.7   Smokeless tobacco: Never Used  Vaping Use   Vaping Use: Never used  Substance and Sexual Activity   Alcohol use: Yes    Alcohol/week: 0.0 standard drinks    Comment: ocassional   Drug use: No   Sexual activity: Not Currently    Partners: Male    Birth control/protection: Abstinence  Other Topics Concern   Not on file  Social History Narrative   Not on file   Social Determinants of Health   Financial Resource Strain: Low Risk    Difficulty of Paying Living Expenses: Not hard at all  Food Insecurity: No Food Insecurity   Worried About Programme researcher, broadcasting/film/video in the Last Year: Never true   Ran Out of Food in the Last Year: Never true  Transportation Needs: No Transportation  Needs   Lack of Transportation (Medical): No   Lack of Transportation (Non-Medical): No  Physical Activity: Insufficiently Active   Days of Exercise per Week: 2 days   Minutes of Exercise per Session: 60 min  Stress: Stress Concern Present   Feeling of Stress : Very much  Social Connections: Socially Isolated   Frequency of Communication with Friends and Family: More than three times a week   Frequency of Social Gatherings with Friends and Family: Three times a week   Attends Religious Services: Never   Active Member of Clubs or Organizations: No   Attends Banker Meetings: Never   Marital Status: Never married    Family History  Problem Relation Age of Onset   Diabetes Father     Outpatient Encounter Medications as of 01/24/2020  Medication Sig   albuterol (VENTOLIN HFA) 108 (90 Base) MCG/ACT inhaler  INHALE TWO PUFFS INTO THE LUNGS EVERY 4 HOURS AS NEEDED   ALPRAZolam (XANAX) 0.5 MG tablet Take 0.5 mg by mouth at bedtime as needed for anxiety.   APPLE CIDER VINEGAR PO Take by mouth.   Cholecalciferol (VITAMIN D3) 125 MCG (5000 UT) CAPS Take 1 capsule (5,000 Units total) by mouth daily.   doxepin (SINEQUAN) 10 MG capsule Take 10 mg by mouth at bedtime.   escitalopram (LEXAPRO) 10 MG tablet Take 10 mg by mouth daily.   fenofibrate (TRICOR) 48 MG tablet Take 1 tablet (48 mg total) by mouth daily.   glipiZIDE (GLUCOTROL XL) 10 MG 24 hr tablet Take 1 tablet by mouth once daily with breakfast   glucose blood test strip Use as instructed   hydroxypropyl methylcellulose / hypromellose (ISOPTO TEARS / GONIOVISC) 2.5 % ophthalmic solution Place 1 drop into both eyes 3 (three) times daily as needed for dry eyes.   ibuprofen (ADVIL) 800 MG tablet Take 1 tablet (800 mg total) by mouth every 8 (eight) hours as needed.   insulin isophane & regular human (NOVOLIN 70/30 FLEXPEN) (70-30) 100 UNIT/ML KwikPen INJECT 36 UNITS INTO THE SKIN 2 (TWO) TIMES DAILY WITH A  MEAL.   Insulin Pen Needle (BD PEN NEEDLE NANO 2ND GEN) 32G X 4 MM MISC USE AS DIRECTED TWICE A DAY   Insulin Syringe-Needle U-100 (RELION INSULIN SYR 0.3CC/30G) 30G X 5/16" 0.3 ML MISC Use to inject insulin 2 x a day   metroNIDAZOLE (FLAGYL) 500 MG tablet Take 1 tablet (500 mg total) by mouth 2 (two) times daily.   Multiple Vitamin (MULTIVITAMIN WITH MINERALS) TABS tablet Take 1 tablet by mouth daily.   rosuvastatin (CRESTOR) 10 MG tablet Take 1 tablet (10 mg total) by mouth daily.   [DISCONTINUED] Continuous Blood Gluc Sensor (FREESTYLE LIBRE 14 DAY SENSOR) MISC Inject 1 each into the skin every 14 (fourteen) days. Use as directed.   [DISCONTINUED] fenofibrate (TRICOR) 48 MG tablet Take 1 tablet (48 mg total) by mouth daily.   [DISCONTINUED] glipiZIDE (GLUCOTROL XL) 5 MG 24 hr tablet Take 1 tablet by mouth once daily with breakfast   [DISCONTINUED] glucose blood test strip Use as instructed   [DISCONTINUED] NOVOLIN 70/30 FLEXPEN (70-30) 100 UNIT/ML KwikPen INJECT 36 UNITS INTO THE SKIN 2 (TWO) TIMES DAILY WITH A MEAL.   No facility-administered encounter medications on file as of 01/24/2020.    ALLERGIES: Allergies  Allergen Reactions   Metformin And Related     Sensitivity to med; stomach issues    VACCINATION STATUS: Immunization History  Administered Date(s) Administered   PPD Test 10/17/2017    Diabetes She presents for her follow-up diabetic visit. She has type 2 diabetes mellitus. Her disease course has been worsening. There are no hypoglycemic associated symptoms. Associated symptoms include polydipsia and polyuria. There are no hypoglycemic complications. Symptoms are worsening. There are no diabetic complications. Risk factors for coronary artery disease include diabetes mellitus. Current diabetic treatment includes insulin injections and oral agent (monotherapy). She is compliant with treatment none of the time. Her weight is fluctuating minimally. She is following  a generally unhealthy diet. When asked about meal planning, she reported none. She has not had a previous visit with a dietitian. She participates in exercise intermittently. Her home blood glucose trend is increasing steadily. (She presents with no meter nor logs. Her point-of-care A1c is 10.6%, progressively increasing from 9.9%. She denies hypoglycemia.  ) An ACE inhibitor/angiotensin II receptor blocker is not being taken.  Review of systems  Constitutional: + Minimally fluctuating body weight,  current  Body mass index is 35.48 kg/m. , no fatigue, no subjective hyperthermia, no subjective hypothermia Eyes: no blurry vision, no xerophthalmia ENT: no sore throat, no nodules palpated in throat, no dysphagia/odynophagia, no hoarseness Cardiovascular: no Chest Pain, no Shortness of Breath, no palpitations, no leg swelling Respiratory: no cough, no shortness of breath Gastrointestinal: no Nausea/Vomiting/Diarhhea Musculoskeletal: no muscle/joint aches Skin: no rashes Neurological: no tremors, no numbness, no tingling, no dizziness Psychiatric: no depression, no anxiety   Objective:    BP 110/82    Pulse (!) 52    Ht 5\' 1"  (1.549 m)    Wt 187 lb 12.8 oz (85.2 kg)    LMP 01/04/2020 (Approximate)    BMI 35.48 kg/m   Wt Readings from Last 3 Encounters:  01/24/20 187 lb 12.8 oz (85.2 kg)  01/23/20 188 lb (85.3 kg)  09/12/19 191 lb 9.6 oz (86.9 kg)     Physical Exam- Limited  Constitutional:  Body mass index is 35.48 kg/m. , not in acute distress, normal state of mind, +. Reluctant affect. Eyes:  EOMI, no exophthalmos Neck: Supple Thyroid: No gross goiter Respiratory: Adequate breathing efforts Musculoskeletal: no gross deformities, strength intact in all four extremities, no gross restriction of joint movements Skin:  no rashes, no hyperemia Neurological: no tremor with outstretched hands   CMP ( most recent) CMP     Component Value Date/Time   NA 135 04/02/2019 1042   K  3.9 04/02/2019 1042   CL 105 04/02/2019 1042   CO2 21 (L) 04/02/2019 1042   GLUCOSE 177 (H) 04/02/2019 1042   BUN 21 01/23/2020 0000   CREATININE 0.7 01/23/2020 0000   CREATININE 0.45 04/02/2019 1042   CALCIUM 8.5 (L) 04/02/2019 1042   PROT 7.5 04/02/2019 1042   ALBUMIN 3.7 04/02/2019 1042   AST 43 (H) 04/02/2019 1042   ALT 44 04/02/2019 1042   ALKPHOS 69 04/02/2019 1042   BILITOT 0.4 04/02/2019 1042   GFRNONAA >60 04/02/2019 1042   GFRAA >60 04/02/2019 1042     Diabetic Labs (most recent): Lab Results  Component Value Date   HGBA1C 10.6 (A) 01/24/2020   HGBA1C 10.3 (A) 07/26/2019   HGBA1C 9.9 (H) 04/02/2019     Lipid Panel ( most recent) Lipid Panel     Component Value Date/Time   CHOL 238 (A) 01/23/2020 0000   TRIG 396 (A) 01/23/2020 0000   HDL 32 (A) 01/23/2020 0000   CHOLHDL 7.8 04/02/2019 1042   VLDL UNABLE TO CALCULATE IF TRIGLYCERIDE OVER 400 mg/dL 16/10/960410/13/2020 54091042   LDLCALC 136 01/23/2020 0000   LDLDIRECT 133.0 (H) 04/02/2019 1042       Assessment & Plan:   1.  Uncontrolled type 2 diabetes Extremely high risk - Ann Richards has currently uncontrolled symptomatic type 2 DM since 29 years of age.  She presents with no meter nor logs. Her point-of-care A1c is 10.6%, progressively increasing from 9.9%. She denies hypoglycemia.    She has a history of diabetic ketoacidosis which required hospitalization in October 2019.  She remains unengaged.  Her compliance is questionable at this point.  Her recent labs are reviewed with her.   -Once again, I had a long discussion with  her about the progressive nature of diabetes and the pathology behind its complications. -Her care is complicated by noncompliance/nonadherence , she does not report gross complications from diabetes yet, however she remains at a high risk for  more acute and chronic complications which include CAD, CVA, CKD, retinopathy, and neuropathy. These are all discussed in detail with  her.  - I have counseled her on diet management by adopting a carbohydrate restricted/protein rich diet. - she  admits there is a room for improvement in her diet and drink choices. -  Suggestion is made for her to avoid simple carbohydrates  from her diet including Cakes, Sweet Desserts / Pastries, Ice Cream, Soda (diet and regular), Sweet Tea, Candies, Chips, Cookies, Sweet Pastries,  Store Bought Juices, Alcohol in Excess of  1-2 drinks a day, Artificial Sweeteners, Coffee Creamer, and "Sugar-free" Products. This will help patient to have stable blood glucose profile and potentially avoid unintended weight gain.  - I encouraged her to switch to  unprocessed or minimally processed complex starch and increased protein intake (animal or plant source), fruits, and vegetables.  - she is advised to stick to a routine mealtimes to eat 3 meals  a day and avoid unnecessary snacks ( to snack only to correct hypoglycemia).   - she missed her appointment with CDE, will  Re-scheduled with Norm Salt, RDN, CDE for individualized diabetes education.  - I have approached her with the following individualized plan to manage diabetes and patient agrees:   -This patient would ideally need intensive treatment with basal/bolus insulin in order for her to control diabetes to target, however she is unreliable for proper monitoring for safe use of insulin.   -I do long discussion with her about the need for treatment to monitor blood glucose 4 times a day-before meals and at bedtime, resume and continue Novolin 70/30  36 units twice daily with breakfast and with supper for premeal blood glucose readings above 90 mg per DL, and return in 7-42 days for reevaluation. -She would have benefited from a CGM device, her insurance did not provide coverage. - she is warned not to take insulin without proper monitoring per orders.  - she is encouraged to call clinic for blood glucose levels less than 70 or above 300 mg  /dl. -She did not tolerate Metformin causing significant GI intolerance.  She is advised to increase glipizide to 10 mg XL p.o. daily at breakfast.   - she will be considered for incretin therapy as appropriate next visit.   2) Blood Pressure /Hypertension: Her blood pressure is controlled to target.   She is not  not taking any antihypertensive medications for now.  She will be considered for intervention if it remains above 130/80 next visit.  3) Lipids/Hyperlipidemia: She has significant dyslipidemia with hypertriglyceridemia 386, LDL 136 recently initiated on fenofibrate. I discussed and added Crestor 10 mg p.o. nightly, continue fenofibrate 48 mg p.o. daily at bedtime.    4) vitamin D deficiency: She he is on ongoing treatment with vitamin D3 5000 units daily x90 days.  - she is  advised to maintain close follow up with Annalee Genta, DO for primary care needs, as well as her other providers for optimal and coordinated care.  - Time spent on this patient care encounter:  45 min, of which > 50% was spent in  counseling and the rest reviewing her blood glucose logs , discussing her hypoglycemia and hyperglycemia episodes, reviewing her current and  previous labs / studies  ( including abstraction from other facilities) and medications  doses and developing a  long term treatment plan and documenting her care.   Please refer to Patient Instructions for Blood Glucose Monitoring and Insulin/Medications Dosing  Guide"  in media tab for additional information. Please  also refer to " Patient Self Inventory" in the Media  tab for reviewed elements of pertinent patient history.  Ann Richards participated in the discussions, expressed understanding, and voiced agreement with the above plans.  All questions were answered to her satisfaction. she is encouraged to contact clinic should she have any questions or concerns prior to her return visit.     Follow up plan: - Return in about  10 days (around 02/03/2020) for F/U with Meter and Logs Only - no Labs.  Marquis Lunch, MD [facility] [address] [place], [place] [address] Phone: [phone]  Fax: [phone]    01/24/2020, 1:38 PM  This note was partially dictated with voice recognition software. Similar sounding words can be transcribed inadequately or may not  be corrected upon review.

## 2020-01-24 NOTE — Patient Instructions (Signed)

## 2020-01-27 LAB — CYTOLOGY - PAP
Adequacy: ABSENT
Chlamydia: NEGATIVE
Comment: NEGATIVE
Comment: NEGATIVE
Comment: NORMAL
Diagnosis: NEGATIVE
High risk HPV: NEGATIVE
Neisseria Gonorrhea: NEGATIVE

## 2020-01-28 ENCOUNTER — Ambulatory Visit: Payer: BLUE CROSS/BLUE SHIELD | Admitting: Family Medicine

## 2020-01-28 ENCOUNTER — Other Ambulatory Visit: Payer: Self-pay | Admitting: "Endocrinology

## 2020-01-28 DIAGNOSIS — E1165 Type 2 diabetes mellitus with hyperglycemia: Secondary | ICD-10-CM

## 2020-01-31 ENCOUNTER — Encounter: Payer: Self-pay | Admitting: Family Medicine

## 2020-02-07 ENCOUNTER — Ambulatory Visit: Payer: BLUE CROSS/BLUE SHIELD | Admitting: "Endocrinology

## 2020-02-13 ENCOUNTER — Ambulatory Visit: Payer: BLUE CROSS/BLUE SHIELD | Admitting: Family Medicine

## 2020-02-19 ENCOUNTER — Ambulatory Visit: Payer: BLUE CROSS/BLUE SHIELD | Admitting: "Endocrinology

## 2020-02-20 ENCOUNTER — Ambulatory Visit: Payer: BLUE CROSS/BLUE SHIELD | Admitting: Family Medicine

## 2020-02-21 ENCOUNTER — Encounter: Payer: Self-pay | Admitting: Family Medicine

## 2020-03-17 ENCOUNTER — Other Ambulatory Visit: Payer: Self-pay

## 2020-03-17 ENCOUNTER — Other Ambulatory Visit: Payer: BLUE CROSS/BLUE SHIELD

## 2020-03-17 DIAGNOSIS — Z20822 Contact with and (suspected) exposure to covid-19: Secondary | ICD-10-CM

## 2020-03-18 LAB — NOVEL CORONAVIRUS, NAA: SARS-CoV-2, NAA: NOT DETECTED

## 2020-03-18 LAB — SARS-COV-2, NAA 2 DAY TAT

## 2020-04-13 ENCOUNTER — Other Ambulatory Visit: Payer: Self-pay

## 2020-04-13 ENCOUNTER — Telehealth: Payer: Self-pay | Admitting: "Endocrinology

## 2020-04-13 DIAGNOSIS — E1165 Type 2 diabetes mellitus with hyperglycemia: Secondary | ICD-10-CM

## 2020-04-13 MED ORDER — BD PEN NEEDLE NANO 2ND GEN 32G X 4 MM MISC: 0 refills | Status: DC

## 2020-04-13 MED ORDER — NOVOLIN 70/30 FLEXPEN (70-30) 100 UNIT/ML ~~LOC~~ SUPN: PEN_INJECTOR | SUBCUTANEOUS | 0 refills | Status: DC

## 2020-04-13 NOTE — Telephone Encounter (Signed)
Sent in

## 2020-04-13 NOTE — Telephone Encounter (Signed)
Patient is calling and requesting a refill   insulin isophane & regular human (NOVOLIN 70/30 FLEXPEN) (70-30) 100 UNIT/ML KwikPen  She is also needing the needle caps.   [id number] - , [place] - [address] AT [facility] Phone:  [phone]  Fax:  [phone]

## 2020-04-21 ENCOUNTER — Other Ambulatory Visit: Payer: Self-pay | Admitting: Women's Health

## 2020-04-21 ENCOUNTER — Other Ambulatory Visit: Payer: Self-pay

## 2020-04-21 MED ORDER — METRONIDAZOLE 500 MG PO TABS: 500.0000 mg | ORAL_TABLET | Freq: Two times a day (BID) | ORAL | 0 refills | Status: DC

## 2020-04-21 MED ORDER — FLUCONAZOLE 150 MG PO TABS: 150.0000 mg | ORAL_TABLET | Freq: Once | ORAL | 0 refills | Status: AC

## 2020-04-22 ENCOUNTER — Ambulatory Visit: Payer: BLUE CROSS/BLUE SHIELD | Admitting: "Endocrinology

## 2020-04-23 ENCOUNTER — Ambulatory Visit: Payer: BLUE CROSS/BLUE SHIELD | Admitting: "Endocrinology

## 2020-04-27 ENCOUNTER — Encounter: Payer: Self-pay | Admitting: "Endocrinology

## 2020-04-27 ENCOUNTER — Ambulatory Visit (INDEPENDENT_AMBULATORY_CARE_PROVIDER_SITE_OTHER): Payer: BLUE CROSS/BLUE SHIELD | Admitting: "Endocrinology

## 2020-04-27 ENCOUNTER — Other Ambulatory Visit: Payer: Self-pay

## 2020-04-27 VITALS — BP 146/95 | HR 84 | Ht 61.0 in | Wt 185.2 lb

## 2020-04-27 DIAGNOSIS — E782 Mixed hyperlipidemia: Secondary | ICD-10-CM

## 2020-04-27 DIAGNOSIS — E1165 Type 2 diabetes mellitus with hyperglycemia: Secondary | ICD-10-CM

## 2020-04-27 DIAGNOSIS — E559 Vitamin D deficiency, unspecified: Secondary | ICD-10-CM | POA: Diagnosis not present

## 2020-04-27 LAB — POCT GLYCOSYLATED HEMOGLOBIN (HGB A1C): Hemoglobin A1C: 12 % — AB (ref 4.0–5.6)

## 2020-04-27 MED ORDER — RELION LANCETS ULTRA-THIN 30G MISC: 2 refills | Status: DC

## 2020-04-27 MED ORDER — BD PEN NEEDLE SHORT U/F 31G X 8 MM MISC: 1.0000 | 2 refills | Status: DC

## 2020-04-27 MED ORDER — RELION BLOOD GLUCOSE TEST VI STRP: ORAL_STRIP | 2 refills | Status: DC

## 2020-04-27 NOTE — Patient Instructions (Signed)

## 2020-04-27 NOTE — Progress Notes (Signed)
04/27/2020, 3:31 PM   Endocrinology follow-up note    Subjective:    Patient ID: Ann Richards, female    DOB: July 28, 1990.  Ann Richards is being seen in follow-up for management of currently uncontrolled symptomatic diabetes requested by  Pcp, No.   Past Medical History:  Diagnosis Date  . Anxiety   . Depression   . Diabetes mellitus without complication (HCC)   . Insomnia     Past Surgical History:  Procedure Laterality Date  . LAPAROSCOPIC APPENDECTOMY N/A 08/25/2016   Procedure: APPENDECTOMY LAPAROSCOPIC;  Surgeon: Franky MachoMark Jenkins, MD;  Location: [facility];  Service: General;  Laterality: N/A;    Social History   Socioeconomic History  . Marital status: Single    Spouse name: Not on file  . Number of children: Not on file  . Years of education: Not on file  . Highest education level: Not on file  Occupational History  . Not on file  Tobacco Use  . Smoking status: Former Smoker    Packs/day: 0.25    Years: 1.00    Pack years: 0.25    Types: Cigarettes    Start date: 03/14/2008    Quit date: 05/14/2009    Years since quitting: 10.9  . Smokeless tobacco: Never Used  Vaping Use  . Vaping Use: Never used  Substance and Sexual Activity  . Alcohol use: Yes    Alcohol/week: 0.0 standard drinks    Comment: ocassional  . Drug use: No  . Sexual activity: Not Currently    Partners: Male    Birth control/protection: Abstinence  Other Topics Concern  . Not on file  Social History Narrative  . Not on file   Social Determinants of Health   Financial Resource Strain: Low Risk   . Difficulty of Paying Living Expenses: Not hard at all  Food Insecurity: No Food Insecurity  . Worried About Programme researcher, broadcasting/film/videounning Out of Food in the Last Year: Never true  . Ran Out of Food in the Last Year: Never true  Transportation Needs: No Transportation Needs  . Lack  of Transportation (Medical): No  . Lack of Transportation (Non-Medical): No  Physical Activity: Insufficiently Active  . Days of Exercise per Week: 2 days  . Minutes of Exercise per Session: 60 min  Stress: Stress Concern Present  . Feeling of Stress : Very much  Social Connections: Socially Isolated  . Frequency of Communication with Friends and Family: More than three times a week  . Frequency of Social Gatherings with Friends and Family: Three times a week  . Attends Religious Services: Never  . Active Member of Clubs or Organizations: No  . Attends BankerClub or Organization Meetings: Never  . Marital Status: Never married    Family History  Problem Relation Age of Onset  . Diabetes Father     Outpatient Encounter Medications as of 04/27/2020  Medication Sig  . albuterol (VENTOLIN HFA) 108 (90 Base) MCG/ACT inhaler INHALE TWO  PUFFS INTO THE LUNGS EVERY 4 HOURS AS NEEDED  . ALPRAZolam (XANAX) 0.5 MG tablet Take 0.5 mg by mouth at bedtime as needed for anxiety.  . APPLE CIDER VINEGAR PO Take by mouth.  . Cholecalciferol (VITAMIN D3) 125 MCG (5000 UT) CAPS Take 1 capsule (5,000 Units total) by mouth daily.  Marland Kitchen doxepin (SINEQUAN) 10 MG capsule Take 10 mg by mouth at bedtime.  Marland Kitchen escitalopram (LEXAPRO) 10 MG tablet Take 10 mg by mouth daily.  . fenofibrate (TRICOR) 48 MG tablet Take 1 tablet (48 mg total) by mouth daily.  Marland Kitchen glipiZIDE (GLUCOTROL XL) 10 MG 24 hr tablet Take 1 tablet by mouth once daily with breakfast  . glucose blood (RELION GLUCOSE TEST STRIPS) test strip Use as instructed  . hydroxypropyl methylcellulose / hypromellose (ISOPTO TEARS / GONIOVISC) 2.5 % ophthalmic solution Place 1 drop into both eyes 3 (three) times daily as needed for dry eyes.  Marland Kitchen ibuprofen (ADVIL) 800 MG tablet Take 1 tablet (800 mg total) by mouth every 8 (eight) hours as needed.  . insulin isophane & regular human (NOVOLIN 70/30 FLEXPEN) (70-30) 100 UNIT/ML KwikPen INJECT 36 UNITS INTO THE SKIN 2 (TWO)  TIMES DAILY WITH A MEAL.  Marland Kitchen Insulin Pen Needle (B-D ULTRAFINE III SHORT PEN) 31G X 8 MM MISC 1 each by Does not apply route as directed.  . Insulin Syringe-Needle U-100 (RELION INSULIN SYR 0.3CC/30G) 30G X 5/16" 0.3 ML MISC Use to inject insulin 2 x a day  . metroNIDAZOLE (FLAGYL) 500 MG tablet Take 1 tablet (500 mg total) by mouth 2 (two) times daily.  . Multiple Vitamin (MULTIVITAMIN WITH MINERALS) TABS tablet Take 1 tablet by mouth daily.  . ReliOn Ultra Thin Lancets MISC Use to test glucose 4 times a day  . rosuvastatin (CRESTOR) 10 MG tablet Take 1 tablet (10 mg total) by mouth daily.  . [DISCONTINUED] glucose blood test strip Use as instructed  . [DISCONTINUED] Insulin Pen Needle (BD PEN NEEDLE NANO 2ND GEN) 32G X 4 MM MISC USE AS DIRECTED TWICE A DAY   No facility-administered encounter medications on file as of 04/27/2020.    ALLERGIES: Allergies  Allergen Reactions  . Metformin And Related     Sensitivity to med; stomach issues    VACCINATION STATUS: Immunization History  Administered Date(s) Administered  . PPD Test 10/17/2017    Diabetes She presents for her follow-up diabetic visit. She has type 2 diabetes mellitus. Her disease course has been worsening. There are no hypoglycemic associated symptoms. Associated symptoms include blurred vision, polydipsia and polyuria. There are no hypoglycemic complications. Symptoms are worsening. There are no diabetic complications. Risk factors for coronary artery disease include diabetes mellitus. Current diabetic treatment includes insulin injections and oral agent (monotherapy). She is compliant with treatment none of the time. Her weight is fluctuating minimally. She is following a generally unhealthy diet. When asked about meal planning, she reported none. She has not had a previous visit with a dietitian. She participates in exercise intermittently. Her home blood glucose trend is increasing steadily. Her overall blood glucose range is  >200 mg/dl. (She presents with no logs, her mid between 0-1 testing 30, 237 mg per DL.  Her point-of-care A1c is 12% increasing from 10.6% during her last visit.  She denies hypoglycemia. ) An ACE inhibitor/angiotensin II receptor blocker is not being taken.     Review of systems  Constitutional: + Minimally fluctuating body weight,  current  Body mass index is 34.99 kg/m. , no  fatigue, no subjective hyperthermia, no subjective hypothermia Eyes: no blurry vision, no xerophthalmia ENT: no sore throat, no nodules palpated in throat, no dysphagia/odynophagia, no hoarseness Cardiovascular: no Chest Pain, no Shortness of Breath, no palpitations, no leg swelling Respiratory: no cough, no shortness of breath Gastrointestinal: no Nausea/Vomiting/Diarhhea Musculoskeletal: no muscle/joint aches Skin: no rashes Neurological: no tremors, no numbness, no tingling, no dizziness Psychiatric: no depression, no anxiety   Objective:    BP (!) 146/95   Pulse 84   Ht 5\' 1"  (1.549 m)   Wt 185 lb 3.2 oz (84 kg)   BMI 34.99 kg/m   Wt Readings from Last 3 Encounters:  04/27/20 185 lb 3.2 oz (84 kg)  01/24/20 187 lb 12.8 oz (85.2 kg)  01/23/20 188 lb (85.3 kg)      CMP ( most recent) CMP     Component Value Date/Time   NA 135 01/23/2020 0941   K 4.2 01/23/2020 0941   CL 99 01/23/2020 0941   CO2 19 (L) 01/23/2020 0941   GLUCOSE 243 (H) 01/23/2020 0941   GLUCOSE 177 (H) 04/02/2019 1042   BUN 15 01/23/2020 0941   CREATININE 0.72 01/23/2020 0941   CALCIUM 9.6 01/23/2020 0941   PROT 7.2 01/23/2020 0941   ALBUMIN 4.4 01/23/2020 0941   AST 13 01/23/2020 0941   ALT 18 01/23/2020 0941   ALKPHOS 89 01/23/2020 0941   BILITOT <0.2 01/23/2020 0941   GFRNONAA 114 01/23/2020 0941   GFRAA 131 01/23/2020 0941     Diabetic Labs (most recent): Lab Results  Component Value Date   HGBA1C 12.0 (A) 04/27/2020   HGBA1C 10.6 (A) 01/24/2020   HGBA1C 10.3 (A) 07/26/2019     Lipid Panel ( most  recent) Lipid Panel     Component Value Date/Time   CHOL 238 (H) 01/23/2020 0941   TRIG 386 (H) 01/23/2020 0941   HDL 32 (L) 01/23/2020 0941   CHOLHDL 7.8 04/02/2019 1042   VLDL UNABLE TO CALCULATE IF TRIGLYCERIDE OVER 400 mg/dL 04/04/2019 41/66/0630   LDLCALC 136 (H) 01/23/2020 0941   LDLDIRECT 133.0 (H) 04/02/2019 1042       Assessment & Plan:   1.  Uncontrolled type 2 diabetes Extremely high risk - Venera L Burgert has currently uncontrolled symptomatic type 2 DM since 29 years of age. She presents with no logs, her mid between 0-1 testing 30, 237 mg per DL.  Her point-of-care A1c is 12% increasing from 10.6% during her last visit.  She denies hypoglycemia.   She has a history of diabetic ketoacidosis which required hospitalization in October 2019.  She remains unengaged.  Her compliance is questionable at this point.  Her recent labs are reviewed with her.   -Once again, I had a long discussion with  her about the progressive nature of diabetes and the pathology behind its complications. -Her care is complicated by noncompliance/nonadherence , she does not report gross complications from diabetes yet, however she remains at a high risk for more acute and chronic complications which include CAD, CVA, CKD, retinopathy, and neuropathy. These are all discussed in detail with her.  - I have counseled her on diet management by adopting a carbohydrate restricted/protein rich diet.  - she  admits there is a room for improvement in her diet and drink choices. -  Suggestion is made for her to avoid simple carbohydrates  from her diet including Cakes, Sweet Desserts / Pastries, Ice Cream, Soda (diet and regular), Sweet Tea, Candies, Chips, Cookies, Sweet Pastries,  Store Bought Juices, Alcohol in Excess of  1-2 drinks a day, Artificial Sweeteners, Coffee Creamer, and "Sugar-free" Products. This will help patient to have stable blood glucose profile and potentially avoid unintended weight  gain.   - I encouraged her to switch to  unprocessed or minimally processed complex starch and increased protein intake (animal or plant source), fruits, and vegetables.  - she is advised to stick to a routine mealtimes to eat 3 meals  a day and avoid unnecessary snacks ( to snack only to correct hypoglycemia).   - she missed her appointment with CDE, will  Re-scheduled with Norm Salt, RDN, CDE for individualized diabetes education.  - I have approached her with the following individualized plan to manage diabetes and patient agrees:   -She remains alarmingly noncompliant, unengaged to her self-care.    This patient would ideally need intensive treatment with basal/bolus insulin in order for her to control diabetes to target, however she is unreliable for proper monitoring for safe use of insulin.   -I urged her to resume her current regimen of premixed insulin, Novolin 70/30  36 units twice daily with breakfast and with supper for premeal blood glucose readings above 90 mg per DL, and return in 6-94 days for reevaluation. -She would have benefited from a CGM device, her insurance did not provide coverage. - she is warned not to take insulin without proper monitoring per orders.  - she is encouraged to call clinic for blood glucose levels less than 70 or above 300 mg /dl. -She did not tolerate Metformin causing significant GI intolerance.  She is advised to increase glipizide to 10 mg XL p.o. daily at breakfast.   - she will be considered for incretin therapy as appropriate next visit.   2) Blood Pressure /Hypertension:  Her blood pressure is not controlled to target.  She is not  not taking any antihypertensive medications for now.  She will be considered for intervention if it remains above 130/80 next visit.  3) Lipids/Hyperlipidemia: She has significant dyslipidemia with hypertriglyceridemia 386, LDL 136 recently initiated on fenofibrate.  She is advised to continue  Crestor 10  mg p.o. nightly, continue fenofibrate 48 mg p.o. daily at bedtime.    4) vitamin D deficiency: She he is on ongoing treatment with vitamin D3 5000 units daily x90 days.  - she is  advised to maintain close follow up with Pcp, No for primary care needs, as well as her other providers for optimal and coordinated care.  - Time spent on this patient care encounter:  35 min, of which > 50% was spent in  counseling and the rest reviewing her blood glucose logs , discussing her hypoglycemia and hyperglycemia episodes, reviewing her current and  previous labs / studies  ( including abstraction from other facilities) and medications  doses and developing a  long term treatment plan and documenting her care.   Please refer to Patient Instructions for Blood Glucose Monitoring and Insulin/Medications Dosing Guide"  in media tab for additional information. Please  also refer to " Patient Self Inventory" in the Media  tab for reviewed elements of pertinent patient history.  Chauncy Lean Dimauro participated in the discussions, expressed understanding, and voiced agreement with the above plans.  All questions were answered to her satisfaction. she is encouraged to contact clinic should she have any questions or concerns prior to her return visit.    Follow up plan: - Return in about 1 week (around 05/04/2020) for F/U with  Meter and Logs Only - no Labs.  Marquis Lunch, MD [facility] [address] [place], [place] [address] Phone: [phone]  Fax: [phone]    04/27/2020, 3:31 PM  This note was partially dictated with voice recognition software. Similar sounding words can be transcribed inadequately or may not  be corrected upon review.

## 2020-05-06 ENCOUNTER — Encounter: Payer: Self-pay | Admitting: "Endocrinology

## 2020-05-06 ENCOUNTER — Ambulatory Visit (INDEPENDENT_AMBULATORY_CARE_PROVIDER_SITE_OTHER): Payer: BLUE CROSS/BLUE SHIELD | Admitting: "Endocrinology

## 2020-05-06 ENCOUNTER — Other Ambulatory Visit: Payer: Self-pay

## 2020-05-06 DIAGNOSIS — E1165 Type 2 diabetes mellitus with hyperglycemia: Secondary | ICD-10-CM | POA: Diagnosis not present

## 2020-05-06 MED ORDER — NOVOLIN 70/30 FLEXPEN (70-30) 100 UNIT/ML ~~LOC~~ SUPN: PEN_INJECTOR | SUBCUTANEOUS | 0 refills | Status: DC

## 2020-05-06 NOTE — Patient Instructions (Signed)

## 2020-05-06 NOTE — Progress Notes (Signed)
05/06/2020, 12:39 PM  Endocrinology follow-up note   Subjective:    Patient ID: Ann Richards, female    DOB: 12/26/1990.  Ann Richards is being seen in follow-up for management of currently uncontrolled symptomatic diabetes requested by  Pcp, No.   Past Medical History:  Diagnosis Date  . Anxiety   . Depression   . Diabetes mellitus without complication (HCC)   . Insomnia     Past Surgical History:  Procedure Laterality Date  . LAPAROSCOPIC APPENDECTOMY N/A 08/25/2016   Procedure: APPENDECTOMY LAPAROSCOPIC;  Surgeon: Franky Macho, MD;  Location: [facility];  Service: General;  Laterality: N/A;    Social History   Socioeconomic History  . Marital status: Single    Spouse name: Not on file  . Number of children: Not on file  . Years of education: Not on file  . Highest education level: Not on file  Occupational History  . Not on file  Tobacco Use  . Smoking status: Former Smoker    Packs/day: 0.25    Years: 1.00    Pack years: 0.25    Types: Cigarettes    Start date: 03/14/2008    Quit date: 05/14/2009    Years since quitting: 10.9  . Smokeless tobacco: Never Used  Vaping Use  . Vaping Use: Never used  Substance and Sexual Activity  . Alcohol use: Yes    Alcohol/week: 0.0 standard drinks    Comment: ocassional  . Drug use: No  . Sexual activity: Not Currently    Partners: Male    Birth control/protection: Abstinence  Other Topics Concern  . Not on file  Social History Narrative  . Not on file   Social Determinants of Health   Financial Resource Strain: Low Risk   . Difficulty of Paying Living Expenses: Not hard at all  Food Insecurity: No Food Insecurity  . Worried About Programme researcher, broadcasting/film/video in the Last Year: Never true  . Ran Out of Food in the Last Year: Never true  Transportation Needs: No Transportation Needs  . Lack of  Transportation (Medical): No  . Lack of Transportation (Non-Medical): No  Physical Activity: Insufficiently Active  . Days of Exercise per Week: 2 days  . Minutes of Exercise per Session: 60 min  Stress: Stress Concern Present  . Feeling of Stress : Very much  Social Connections: Socially Isolated  . Frequency of Communication with Friends and Family: More than three times a week  . Frequency of Social Gatherings with Friends and Family: Three times a week  . Attends Religious Services: Never  . Active Member of Clubs or Organizations: No  . Attends Banker Meetings: Never  . Marital Status: Never married    Family History  Problem Relation Age of Onset  . Diabetes Father     Outpatient Encounter Medications as of 05/06/2020  Medication Sig  . albuterol (VENTOLIN HFA) 108 (90 Base) MCG/ACT inhaler INHALE TWO PUFFS INTO THE  LUNGS EVERY 4 HOURS AS NEEDED  . ALPRAZolam (XANAX) 0.5 MG tablet Take 0.5 mg by mouth at bedtime as needed for anxiety.  . APPLE CIDER VINEGAR PO Take by mouth.  . Cholecalciferol (VITAMIN D3) 125 MCG (5000 UT) CAPS Take 1 capsule (5,000 Units total) by mouth daily.  Marland Kitchen doxepin (SINEQUAN) 10 MG capsule Take 10 mg by mouth at bedtime.  Marland Kitchen escitalopram (LEXAPRO) 10 MG tablet Take 10 mg by mouth daily.  . fenofibrate (TRICOR) 48 MG tablet Take 1 tablet (48 mg total) by mouth daily.  Marland Kitchen glipiZIDE (GLUCOTROL XL) 10 MG 24 hr tablet Take 1 tablet by mouth once daily with breakfast  . glucose blood (RELION GLUCOSE TEST STRIPS) test strip Use as instructed  . hydroxypropyl methylcellulose / hypromellose (ISOPTO TEARS / GONIOVISC) 2.5 % ophthalmic solution Place 1 drop into both eyes 3 (three) times daily as needed for dry eyes.  Marland Kitchen ibuprofen (ADVIL) 800 MG tablet Take 1 tablet (800 mg total) by mouth every 8 (eight) hours as needed.  . insulin isophane & regular human (NOVOLIN 70/30 FLEXPEN) (70-30) 100 UNIT/ML KwikPen INJECT 40 UNITS INTO THE SKIN 2 (TWO) TIMES  DAILY WITH A MEAL.  Marland Kitchen Insulin Pen Needle (B-D ULTRAFINE III SHORT PEN) 31G X 8 MM MISC 1 each by Does not apply route as directed.  . Insulin Syringe-Needle U-100 (RELION INSULIN SYR 0.3CC/30G) 30G X 5/16" 0.3 ML MISC Use to inject insulin 2 x a day  . metroNIDAZOLE (FLAGYL) 500 MG tablet Take 1 tablet (500 mg total) by mouth 2 (two) times daily.  . Multiple Vitamin (MULTIVITAMIN WITH MINERALS) TABS tablet Take 1 tablet by mouth daily.  . ReliOn Ultra Thin Lancets MISC Use to test glucose 4 times a day  . rosuvastatin (CRESTOR) 10 MG tablet Take 1 tablet (10 mg total) by mouth daily.  . [DISCONTINUED] insulin isophane & regular human (NOVOLIN 70/30 FLEXPEN) (70-30) 100 UNIT/ML KwikPen INJECT 36 UNITS INTO THE SKIN 2 (TWO) TIMES DAILY WITH A MEAL.   No facility-administered encounter medications on file as of 05/06/2020.    ALLERGIES: Allergies  Allergen Reactions  . Metformin And Related     Sensitivity to med; stomach issues    VACCINATION STATUS: Immunization History  Administered Date(s) Administered  . PPD Test 10/17/2017    Diabetes She presents for her follow-up diabetic visit. She has type 2 diabetes mellitus. Her disease course has been worsening. There are no hypoglycemic associated symptoms. Associated symptoms include blurred vision, polydipsia and polyuria. There are no hypoglycemic complications. Symptoms are improving. There are no diabetic complications. Risk factors for coronary artery disease include diabetes mellitus, dyslipidemia, obesity and sedentary lifestyle. Current diabetic treatment includes insulin injections and oral agent (monotherapy). She is compliant with treatment none of the time. Her weight is decreasing steadily. She is following a generally unhealthy diet. When asked about meal planning, she reported none. She has not had a previous visit with a dietitian. She participates in exercise intermittently. Her home blood glucose trend is decreasing steadily.  Her breakfast blood glucose range is generally >200 mg/dl. Her lunch blood glucose range is generally 180-200 mg/dl. Her dinner blood glucose range is generally 180-200 mg/dl. Her bedtime blood glucose range is generally >200 mg/dl. Her overall blood glucose range is >200 mg/dl. (She presents with her logs, monitoring 3-4 times a day, showing improvement in her readings.    Her point-of-care A1c is 12% increasing from 10.6% during her last visit.  She denies hypoglycemia. )  An ACE inhibitor/angiotensin II receptor blocker is not being taken.     Review of systems  Constitutional: + Minimally fluctuating body weight,  current  Body mass index is 36.21 kg/m. , no fatigue, no subjective hyperthermia, no subjective hypothermia Eyes: no blurry vision, no xerophthalmia ENT: no sore throat, no nodules palpated in throat, no dysphagia/odynophagia, no hoarseness Cardiovascular: no Chest Pain, no Shortness of Breath, no palpitations, no leg swelling Respiratory: no cough, no shortness of breath Gastrointestinal: no Nausea/Vomiting/Diarhhea Musculoskeletal: no muscle/joint aches Skin: no rashes Neurological: no tremors, no numbness, no tingling, no dizziness Psychiatric: no depression, no anxiety   Objective:    BP 120/72   Pulse 80   Ht 5' (1.524 m)   Wt 185 lb 6.4 oz (84.1 kg)   BMI 36.21 kg/m   Wt Readings from Last 3 Encounters:  05/06/20 185 lb 6.4 oz (84.1 kg)  04/27/20 185 lb 3.2 oz (84 kg)  01/24/20 187 lb 12.8 oz (85.2 kg)      CMP ( most recent) CMP     Component Value Date/Time   NA 135 01/23/2020 0941   K 4.2 01/23/2020 0941   CL 99 01/23/2020 0941   CO2 19 (L) 01/23/2020 0941   GLUCOSE 243 (H) 01/23/2020 0941   GLUCOSE 177 (H) 04/02/2019 1042   BUN 15 01/23/2020 0941   CREATININE 0.72 01/23/2020 0941   CALCIUM 9.6 01/23/2020 0941   PROT 7.2 01/23/2020 0941   ALBUMIN 4.4 01/23/2020 0941   AST 13 01/23/2020 0941   ALT 18 01/23/2020 0941   ALKPHOS 89 01/23/2020 0941    BILITOT <0.2 01/23/2020 0941   GFRNONAA 114 01/23/2020 0941   GFRAA 131 01/23/2020 0941     Diabetic Labs (most recent): Lab Results  Component Value Date   HGBA1C 12.0 (A) 04/27/2020   HGBA1C 10.6 (A) 01/24/2020   HGBA1C 10.3 (A) 07/26/2019     Lipid Panel ( most recent) Lipid Panel     Component Value Date/Time   CHOL 238 (H) 01/23/2020 0941   TRIG 386 (H) 01/23/2020 0941   HDL 32 (L) 01/23/2020 0941   CHOLHDL 7.8 04/02/2019 1042   VLDL UNABLE TO CALCULATE IF TRIGLYCERIDE OVER 400 mg/dL 04/05/5101 5852   LDLCALC 136 (H) 01/23/2020 0941   LDLDIRECT 133.0 (H) 04/02/2019 1042       Assessment & Plan:   1.  Uncontrolled type 2 diabetes Extremely high risk - Ann Richards has currently uncontrolled symptomatic type 2 DM since 29 years of age.  She presents with her logs, monitoring 3-4 times a day, showing improvement in her readings.   - Her point-of-care A1c is 12% increasing from 10.6% during her last visit.  She denies hypoglycemia.   She has a history of diabetic ketoacidosis which required hospitalization in October 2019.  She remains unengaged.  Her compliance is questionable at this point.  Her recent labs are reviewed with her.   -Once again, I had a long discussion with  her about the progressive nature of diabetes and the pathology behind its complications. -Her care is complicated by noncompliance/nonadherence , she does not report gross complications from diabetes yet, however she remains at a high risk for more acute and chronic complications which include CAD, CVA, CKD, retinopathy, and neuropathy. These are all discussed in detail with her.  - I have counseled her on diet management by adopting a carbohydrate restricted/protein rich diet.  - she  admits there is a room for improvement in her  diet and drink choices. -  Suggestion is made for her to avoid simple carbohydrates  from her diet including Cakes, Sweet Desserts / Pastries, Ice Cream,  Soda (diet and regular), Sweet Tea, Candies, Chips, Cookies, Sweet Pastries,  Store Bought Juices, Alcohol in Excess of  1-2 drinks a day, Artificial Sweeteners, Coffee Creamer, and "Sugar-free" Products. This will help patient to have stable blood glucose profile and potentially avoid unintended weight gain.  - I encouraged her to switch to  unprocessed or minimally processed complex starch and increased protein intake (animal or plant source), fruits, and vegetables.  - she is advised to stick to a routine mealtimes to eat 3 meals  a day and avoid unnecessary snacks ( to snack only to correct hypoglycemia).   - she missed her appointment with CDE, will  Re-scheduled with Norm Salt, RDN, CDE for individualized diabetes education.  - I have approached her with the following individualized plan to manage diabetes and patient agrees:   -She engaged better since last visit.   This patient would ideally need intensive treatment with basal/bolus insulin in order for her to control diabetes to target, however she is unreliable for proper monitoring for safe use of insulin.   -I advised her to increase her premixed insulin, Novolin 70/30 to 40 units twice daily with breakfast and with supper for premeal blood glucose readings above 90 mg per DL.  -She would have benefited from a CGM device, her insurance did not provide coverage. - she is warned not to take insulin without proper monitoring per orders.  - she is encouraged to call clinic for blood glucose levels less than 70 or above 200 mg /dl. -She did not tolerate Metformin causing significant GI intolerance.  She is advised to increase glipizide to 10 mg XL p.o. daily at breakfast.   - she will be considered for incretin therapy as appropriate next visit.   2) Blood Pressure /Hypertension:  Her blood pressure is not controlled to target.  She is not  not taking any antihypertensive medications for now.  She will be considered for  intervention if it remains above 130/80 next visit.  3) Lipids/Hyperlipidemia: She has significant dyslipidemia with hypertriglyceridemia 386, LDL 136 recently initiated on fenofibrate.  She is advised to continue  Crestor  10 mg p.o. nightly, continue fenofibrate 48 mg p.o. daily at bedtime.    4) vitamin D deficiency: She he is on ongoing treatment with vitamin D3 5000 units daily x90 days.  - she is  advised to maintain close follow up with Pcp, No for primary care needs, as well as her other providers for optimal and coordinated care.  - Time spent on this patient care encounter:  35 min, of which > 50% was spent in  counseling and the rest reviewing her blood glucose logs , discussing her hypoglycemia and hyperglycemia episodes, reviewing her current and  previous labs / studies  ( including abstraction from other facilities) and medications  doses and developing a  long term treatment plan and documenting her care.   Please refer to Patient Instructions for Blood Glucose Monitoring and Insulin/Medications Dosing Guide"  in media tab for additional information. Please  also refer to " Patient Self Inventory" in the Media  tab for reviewed elements of pertinent patient history.  Ann Richards participated in the discussions, expressed understanding, and voiced agreement with the above plans.  All questions were answered to her satisfaction. she is encouraged to contact clinic should she  have any questions or concerns prior to her return visit.   Follow up plan: - Return in about 3 months (around 08/06/2020) for Bring Meter and Logs- A1c in Office.  Marquis LunchGebre Sem Mccaughey, MD [facility] [address] [place], [place] [address] Phone: [phone]  Fax: [phone]    05/06/2020, 12:39 PM  This note was partially dictated with voice recognition software. Similar sounding words can be transcribed inadequately or may not  be corrected  upon review.

## 2020-06-04 ENCOUNTER — Other Ambulatory Visit: Payer: Self-pay | Admitting: "Endocrinology

## 2020-06-04 DIAGNOSIS — E1165 Type 2 diabetes mellitus with hyperglycemia: Secondary | ICD-10-CM

## 2020-06-05 ENCOUNTER — Other Ambulatory Visit: Payer: Self-pay | Admitting: "Endocrinology

## 2020-06-05 DIAGNOSIS — E1165 Type 2 diabetes mellitus with hyperglycemia: Secondary | ICD-10-CM

## 2020-07-08 ENCOUNTER — Telehealth: Payer: Self-pay | Admitting: Nurse Practitioner

## 2020-07-08 DIAGNOSIS — E1165 Type 2 diabetes mellitus with hyperglycemia: Secondary | ICD-10-CM

## 2020-07-08 MED ORDER — NOVOLIN 70/30 FLEXPEN RELION (70-30) 100 UNIT/ML ~~LOC~~ SUPN: PEN_INJECTOR | SUBCUTANEOUS | 2 refills | Status: DC

## 2020-07-08 NOTE — Telephone Encounter (Signed)
Rx sent 

## 2020-07-08 NOTE — Telephone Encounter (Signed)
Pt is requesting refill on   insulin isophane & regular human (NOVOLIN 70/30 FLEXPEN RELION) (70-30) 100 UNIT/ML KwikPen   Insulin Pen Needle (B-D ULTRAFINE III SHORT PEN) 31G X 8 MM MISC  [id number] - [place], [place] - [address] [place] [address] Phone:  [phone]  Fax:  [phone]

## 2020-07-09 MED ORDER — BD PEN NEEDLE SHORT U/F 31G X 8 MM MISC: 1.0000 | 2 refills | Status: DC

## 2020-07-09 NOTE — Telephone Encounter (Signed)
Rx for pen needles sent.

## 2020-07-09 NOTE — Telephone Encounter (Signed)
Pt called after hours line in regards to only the pt insulin was called in and not the pen needles. Please advise from message below.

## 2020-07-09 NOTE — Addendum Note (Signed)
Addended by: Derrell Lolling on: 07/09/2020 08:41 AM   Modules accepted: Orders

## 2020-07-14 ENCOUNTER — Other Ambulatory Visit: Payer: Self-pay | Admitting: "Endocrinology

## 2020-07-14 DIAGNOSIS — E1165 Type 2 diabetes mellitus with hyperglycemia: Secondary | ICD-10-CM

## 2020-07-15 MED ORDER — INSULIN ISOPHANE & REGULAR (HUMAN 70-30)100 UNIT/ML KWIKPEN: 40.0000 [IU] | PEN_INJECTOR | Freq: Two times a day (BID) | SUBCUTANEOUS | 0 refills | Status: DC

## 2020-07-22 ENCOUNTER — Emergency Department (HOSPITAL_COMMUNITY)
Admission: EM | Admit: 2020-07-22 | Discharge: 2020-07-22 | Disposition: A | Discharge status: Home / Self Care | Payer: No Typology Code available for payment source | Attending: Emergency Medicine | Admitting: Emergency Medicine

## 2020-07-22 ENCOUNTER — Encounter (HOSPITAL_COMMUNITY): Payer: Self-pay

## 2020-07-22 ENCOUNTER — Other Ambulatory Visit: Payer: Self-pay

## 2020-07-22 DIAGNOSIS — K0889 Other specified disorders of teeth and supporting structures: Secondary | ICD-10-CM | POA: Diagnosis not present

## 2020-07-22 DIAGNOSIS — E119 Type 2 diabetes mellitus without complications: Secondary | ICD-10-CM | POA: Insufficient documentation

## 2020-07-22 DIAGNOSIS — Z87891 Personal history of nicotine dependence: Secondary | ICD-10-CM | POA: Diagnosis not present

## 2020-07-22 DIAGNOSIS — R519 Headache, unspecified: Secondary | ICD-10-CM | POA: Diagnosis present

## 2020-07-22 MED ORDER — PENICILLIN V POTASSIUM 500 MG PO TABS: 500.0000 mg | ORAL_TABLET | Freq: Four times a day (QID) | ORAL | 0 refills | Status: AC

## 2020-07-22 MED ORDER — OXYCODONE HCL 5 MG PO TABS: 5.0000 mg | ORAL_TABLET | ORAL | 0 refills | Status: DC | PRN

## 2020-07-22 MED ORDER — OXYCODONE HCL 5 MG PO TABS
5.0000 mg | ORAL_TABLET | Freq: Once | ORAL | Status: AC
  Administered 2020-07-22: 5 mg via ORAL
  Filled 2020-07-22: qty 1

## 2020-07-22 MED ORDER — PENICILLIN V POTASSIUM 250 MG PO TABS
500.0000 mg | ORAL_TABLET | Freq: Once | ORAL | Status: AC
  Administered 2020-07-22: 500 mg via ORAL
  Filled 2020-07-22: qty 2

## 2020-07-22 NOTE — ED Provider Notes (Signed)
[facility] Emergency Department Provider Note MRN:  [id number]  Arrival date & time: 07/22/20     Chief Complaint   Face pain History of Present Illness   Ann Richards is a 30 y.o. year-old female with a history of diabetes presenting to the ED with chief complaint of face pain.  Gradual onset pain to the left side of the face near the upper left molars. She explains that it feels like she has "fever in her teeth". Feels like something is wrong with her teeth. Denies having headache type pain, no nausea vomiting, no neck pain, no numbness or weakness to the arms or legs, no fever.  Review of Systems  A complete 10 system review of systems was obtained and all systems are negative except as noted in the HPI and PMH.   Patient's Health History    Past Medical History:  Diagnosis Date  . Anxiety   . Depression   . Diabetes mellitus without complication (HCC)   . Insomnia     Past Surgical History:  Procedure Laterality Date  . LAPAROSCOPIC APPENDECTOMY N/A 08/25/2016   Procedure: APPENDECTOMY LAPAROSCOPIC;  Surgeon: Franky Macho, MD;  Location: [facility];  Service: General;  Laterality: N/A;    Family History  Problem Relation Age of Onset  . Diabetes Father     Social History   Socioeconomic History  . Marital status: Single    Spouse name: Not on file  . Number of children: Not on file  . Years of education: Not on file  . Highest education level: Not on file  Occupational History  . Not on file  Tobacco Use  . Smoking status: Former Smoker    Packs/day: 0.25    Years: 1.00    Pack years: 0.25    Types: Cigarettes    Start date: 03/14/2008    Quit date: 05/14/2009    Years since quitting: 11.1  . Smokeless tobacco: Never Used  Vaping Use  . Vaping Use: Never used  Substance and Sexual Activity  . Alcohol use: Yes    Alcohol/week: 0.0 standard drinks    Comment: ocassional  . Drug use: No  . Sexual activity: Not Currently     Partners: Male    Birth control/protection: Abstinence  Other Topics Concern  . Not on file  Social History Narrative  . Not on file   Social Determinants of Health   Financial Resource Strain: Low Risk   . Difficulty of Paying Living Expenses: Not hard at all  Food Insecurity: No Food Insecurity  . Worried About Programme researcher, broadcasting/film/video in the Last Year: Never true  . Ran Out of Food in the Last Year: Never true  Transportation Needs: No Transportation Needs  . Lack of Transportation (Medical): No  . Lack of Transportation (Non-Medical): No  Physical Activity: Insufficiently Active  . Days of Exercise per Week: 2 days  . Minutes of Exercise per Session: 60 min  Stress: Stress Concern Present  . Feeling of Stress : Very much  Social Connections: Socially Isolated  . Frequency of Communication with Friends and Family: More than three times a week  . Frequency of Social Gatherings with Friends and Family: Three times a week  . Attends Religious Services: Never  . Active Member of Clubs or Organizations: No  . Attends Banker Meetings: Never  . Marital Status: Never married  Intimate Partner Violence: Not At Risk  . Fear of Current or Ex-Partner: No  .  Emotionally Abused: No  . Physically Abused: No  . Sexually Abused: No     Physical Exam   Vitals:   07/22/20 0242  BP: (!) 180/89  Pulse: 94  Resp: 18  Temp: 98.6 F (37 C)  SpO2: 100%    CONSTITUTIONAL: Well-appearing, NAD NEURO:  Alert and oriented x 3, no focal deficits EYES:  eyes equal and reactive ENT/NECK:  no LAD, no JVD CARDIO: Regular rate, well-perfused, normal S1 and S2 PULM:  CTAB no wheezing or rhonchi GI/GU:  normal bowel sounds, non-distended, non-tender MSK/SPINE:  No gross deformities, no edema SKIN:  no rash, atraumatic PSYCH:  Appropriate speech and behavior  *Additional and/or pertinent findings included in MDM below  Diagnostic and Interventional Summary    EKG  Interpretation  Date/Time:    Ventricular Rate:    PR Interval:    QRS Duration:   QT Interval:    QTC Calculation:   R Axis:     Text Interpretation:        Labs Reviewed - No data to display  No orders to display    Medications  penicillin v potassium (VEETID) tablet 500 mg (500 mg Oral Given 07/22/20 0326)  oxyCODONE (Oxy IR/ROXICODONE) immediate release tablet 5 mg (5 mg Oral Given 07/22/20 0326)     Procedures  /  Critical Care Procedures  ED Course and Medical Decision Making  I have reviewed the triage vital signs, the nursing notes, and pertinent available records from the EMR.  Listed above are laboratory and imaging tests that I personally ordered, reviewed, and interpreted and then considered in my medical decision making (see below for details).  Initially triaged as migraine but this is inconsistent with a migraine type presentation. She is having tooth related pain, suspect underlying tooth infection. Exam is without any evidence of abscess or concerning contiguous infection. Normal vital signs, normal neurological exam, appropriate with discharge on antibiotics.       Elmer Sow. Pilar Plate, MD [facility] Emergency [facility] [email or web address]  Final Clinical Impressions(s) / ED Diagnoses     ICD-10-CM   1. Tooth pain  K08.89     ED Discharge Orders         Ordered    penicillin v potassium (VEETID) 500 MG tablet  4 times daily        07/22/20 0337    oxyCODONE (ROXICODONE) 5 MG immediate release tablet  Every 4 hours PRN        07/22/20 1638           Discharge Instructions Discussed with and Provided to Patient:     Discharge Instructions     You were evaluated in the Emergency Department and after careful evaluation, we did not find any emergent condition requiring admission or further testing in the hospital.  Your exam/testing today was overall reassuring. Symptoms may be due to a tooth infection. Please take  penicillin as directed. Use Tylenol and Motrin at home for discomfort. For more significant pain keeping you from sleeping you can use the oxycodone provided.  Please return to the Emergency Department if you experience any worsening of your condition.  Thank you for allowing [place] to be a part of your care.       Sabas Sous, MD 07/22/20 [phone]

## 2020-07-22 NOTE — Discharge Instructions (Addendum)
You were evaluated in the Emergency Department and after careful evaluation, we did not find any emergent condition requiring admission or further testing in the hospital.  Your exam/testing today was overall reassuring. Symptoms may be due to a tooth infection. Please take penicillin as directed. Use Tylenol and Motrin at home for discomfort. For more significant pain keeping you from sleeping you can use the oxycodone provided.  Please return to the Emergency Department if you experience any worsening of your condition.  Thank you for allowing [place] to be a part of your care.

## 2020-07-22 NOTE — ED Triage Notes (Signed)
Pt c/o migraine that started Sunday evening. States it starts in the left side of her head and radiates to her ear and jaw.

## 2020-07-30 ENCOUNTER — Telehealth: Payer: Self-pay | Admitting: Advanced Practice Midwife

## 2020-07-30 ENCOUNTER — Other Ambulatory Visit: Payer: Self-pay | Admitting: Advanced Practice Midwife

## 2020-07-30 MED ORDER — IBUPROFEN 800 MG PO TABS: 800.0000 mg | ORAL_TABLET | Freq: Three times a day (TID) | ORAL | 1 refills | Status: DC | PRN

## 2020-07-30 MED ORDER — FLUCONAZOLE 150 MG PO TABS: ORAL_TABLET | ORAL | 2 refills | Status: DC

## 2020-07-30 NOTE — Telephone Encounter (Signed)
Patient called stating that she has been taking antibiotics and she now has a yeast infection, patient states that Ann Richards knows about this and Ann Richards normally give her something for the yeast infection. Patient states that she also needs a refill of her Ibuprofen 800 mg. Pt [facility] [place]. Please contact pt when sent

## 2020-08-04 ENCOUNTER — Other Ambulatory Visit: Payer: Self-pay | Admitting: "Endocrinology

## 2020-08-04 DIAGNOSIS — E1165 Type 2 diabetes mellitus with hyperglycemia: Secondary | ICD-10-CM

## 2020-08-06 ENCOUNTER — Ambulatory Visit: Payer: BLUE CROSS/BLUE SHIELD | Admitting: Nurse Practitioner

## 2020-08-06 NOTE — Patient Instructions (Incomplete)

## 2020-08-14 ENCOUNTER — Telehealth: Payer: Self-pay | Admitting: *Deleted

## 2020-08-14 MED ORDER — METRONIDAZOLE 500 MG PO TABS: 500.0000 mg | ORAL_TABLET | Freq: Two times a day (BID) | ORAL | 0 refills | Status: DC

## 2020-08-14 MED ORDER — FLUCONAZOLE 100 MG PO TABS
ORAL_TABLET | ORAL | 1 refills | Status: DC
Start: 2020-08-14 — End: 2020-09-03

## 2020-08-14 NOTE — Telephone Encounter (Signed)
Will rx diflucan and flagyl 

## 2020-08-14 NOTE — Telephone Encounter (Signed)
Patient states she is currently taking PCN for a tooth infection and now has a yeast infection and BV.  Discharge is described as itching with odor.  She recently took Diflucan on 2/10 and repeated in 3 days but it did not help.  She has had this problem in the past as she is an uncontrolled diabetic.  She is asking for Metronidazole and Diflucan (150mg  x5 days).  Advised to check back with her pharmacy later today if she did not hear from our office.

## 2020-08-14 NOTE — Addendum Note (Signed)
Addended by: Cyril Mourning A on: 08/14/2020 01:53 PM   Modules accepted: Orders

## 2020-09-03 ENCOUNTER — Other Ambulatory Visit: Payer: Self-pay

## 2020-09-03 ENCOUNTER — Telehealth: Payer: Self-pay | Admitting: Advanced Practice Midwife

## 2020-09-03 ENCOUNTER — Other Ambulatory Visit: Payer: Self-pay | Admitting: Advanced Practice Midwife

## 2020-09-03 ENCOUNTER — Telehealth: Payer: Self-pay

## 2020-09-03 DIAGNOSIS — E1165 Type 2 diabetes mellitus with hyperglycemia: Secondary | ICD-10-CM

## 2020-09-03 MED ORDER — METRONIDAZOLE 500 MG PO TABS: 500.0000 mg | ORAL_TABLET | Freq: Two times a day (BID) | ORAL | 0 refills | Status: DC

## 2020-09-03 MED ORDER — FLUCONAZOLE 100 MG PO TABS: ORAL_TABLET | ORAL | 1 refills | Status: DC

## 2020-09-03 MED ORDER — INSULIN ISOPHANE & REGULAR (HUMAN 70-30)100 UNIT/ML KWIKPEN: 40.0000 [IU] | PEN_INJECTOR | Freq: Two times a day (BID) | SUBCUTANEOUS | 0 refills | Status: DC

## 2020-09-03 NOTE — Telephone Encounter (Signed)
Returned pt's call, no answer, left vm

## 2020-09-03 NOTE — Telephone Encounter (Signed)
Sent in

## 2020-09-03 NOTE — Telephone Encounter (Signed)
PT has a question for a nurse. Says BV and yeast infection is back. Would like for nurse to return her call.

## 2020-09-03 NOTE — Telephone Encounter (Signed)
Patient is asking for a refill on her  insulin isophane & regular human (HUMULIN 70/30 MIX) (70-30) 100 UNIT/ML KwikPen. She has an appt tomorrow

## 2020-09-03 NOTE — Telephone Encounter (Signed)
Pt called with c/o itching, yeasty smelling discharge, and irritation. She explained that she had antibiotics prescribed to her at the beginning of February and had "the worst yeast infection of her life" after that. She has been feeling better, but the yeast and BV have not completely gone away. She is requesting a refill on her meds. Will msg a provider for advice and follow-up with pt.

## 2020-09-04 ENCOUNTER — Ambulatory Visit (INDEPENDENT_AMBULATORY_CARE_PROVIDER_SITE_OTHER): Payer: No Typology Code available for payment source | Admitting: Nurse Practitioner

## 2020-09-04 ENCOUNTER — Other Ambulatory Visit: Payer: Self-pay

## 2020-09-04 ENCOUNTER — Encounter: Payer: Self-pay | Admitting: Nurse Practitioner

## 2020-09-04 VITALS — BP 171/85 | HR 109 | Ht 60.0 in | Wt 188.0 lb

## 2020-09-04 DIAGNOSIS — E782 Mixed hyperlipidemia: Secondary | ICD-10-CM | POA: Diagnosis not present

## 2020-09-04 DIAGNOSIS — E1165 Type 2 diabetes mellitus with hyperglycemia: Secondary | ICD-10-CM

## 2020-09-04 DIAGNOSIS — E559 Vitamin D deficiency, unspecified: Secondary | ICD-10-CM | POA: Diagnosis not present

## 2020-09-04 LAB — POCT GLYCOSYLATED HEMOGLOBIN (HGB A1C): HbA1c, POC (controlled diabetic range): 11.6 % — AB (ref 0.0–7.0)

## 2020-09-04 MED ORDER — INSULIN GLARGINE 100 UNIT/ML SOLOSTAR PEN: 14.0000 [IU] | PEN_INJECTOR | Freq: Every day | SUBCUTANEOUS | 3 refills | Status: DC

## 2020-09-04 MED ORDER — INSULIN LISPRO (1 UNIT DIAL) 100 UNIT/ML (KWIKPEN): 3.0000 [IU] | PEN_INJECTOR | Freq: Three times a day (TID) | SUBCUTANEOUS | 3 refills | Status: DC

## 2020-09-04 NOTE — Patient Instructions (Signed)

## 2020-09-04 NOTE — Progress Notes (Signed)
09/04/2020, 11:56 AM  Endocrinology follow-up note   Subjective:    Patient ID: Ann Richards, female    DOB: 1991/04/28.  Dreya L Fuelling is being seen in follow-up for management of currently uncontrolled symptomatic diabetes requested by  Patient, No Pcp Per.   Past Medical History:  Diagnosis Date  . Anxiety   . Depression   . Diabetes mellitus without complication (HCC)   . Insomnia     Past Surgical History:  Procedure Laterality Date  . LAPAROSCOPIC APPENDECTOMY N/A 08/25/2016   Procedure: APPENDECTOMY LAPAROSCOPIC;  Surgeon: Franky Macho, MD;  Location: [facility];  Service: General;  Laterality: N/A;    Social History   Socioeconomic History  . Marital status: Single    Spouse name: Not on file  . Number of children: Not on file  . Years of education: Not on file  . Highest education level: Not on file  Occupational History  . Not on file  Tobacco Use  . Smoking status: Former Smoker    Packs/day: 0.25    Years: 1.00    Pack years: 0.25    Types: Cigarettes    Start date: 03/14/2008    Quit date: 05/14/2009    Years since quitting: 11.3  . Smokeless tobacco: Never Used  Vaping Use  . Vaping Use: Never used  Substance and Sexual Activity  . Alcohol use: Yes    Alcohol/week: 0.0 standard drinks    Comment: ocassional  . Drug use: No  . Sexual activity: Not Currently    Partners: Male    Birth control/protection: Abstinence  Other Topics Concern  . Not on file  Social History Narrative  . Not on file   Social Determinants of Health   Financial Resource Strain: Low Risk   . Difficulty of Paying Living Expenses: Not hard at all  Food Insecurity: No Food Insecurity  . Worried About Programme researcher, broadcasting/film/video in the Last Year: Never true  . Ran Out of Food in the Last Year: Never true  Transportation Needs: No Transportation Needs   . Lack of Transportation (Medical): No  . Lack of Transportation (Non-Medical): No  Physical Activity: Insufficiently Active  . Days of Exercise per Week: 2 days  . Minutes of Exercise per Session: 60 min  Stress: Stress Concern Present  . Feeling of Stress : Very much  Social Connections: Socially Isolated  . Frequency of Communication with Friends and Family: More than three times a week  . Frequency of Social Gatherings with Friends and Family: Three times a week  . Attends Religious Services: Never  . Active Member of Clubs or Organizations: No  . Attends Banker Meetings: Never  . Marital Status: Never married    Family History  Problem Relation Age of Onset  . Diabetes Father     Outpatient Encounter Medications as of 09/04/2020  Medication Sig  . albuterol (VENTOLIN HFA) 108 (90 Base) MCG/ACT inhaler INHALE TWO PUFFS  INTO THE LUNGS EVERY 4 HOURS AS NEEDED  . ALPRAZolam (XANAX) 0.5 MG tablet Take 0.5 mg by mouth at bedtime as needed for anxiety.  . APPLE CIDER VINEGAR PO Take by mouth.  . Cholecalciferol (VITAMIN D3) 125 MCG (5000 UT) CAPS Take 1 capsule (5,000 Units total) by mouth daily.  Marland Kitchen doxepin (SINEQUAN) 10 MG capsule Take 10 mg by mouth at bedtime.  Marland Kitchen escitalopram (LEXAPRO) 20 MG tablet Take 20 mg by mouth daily.  . fenofibrate (TRICOR) 48 MG tablet TAKE 1 TABLET BY MOUTH EVERY DAY  . fluconazole (DIFLUCAN) 100 MG tablet Take 1 daily for 5 days  . glipiZIDE (GLUCOTROL XL) 10 MG 24 hr tablet TAKE 1 TABLET BY MOUTH ONCE DAILY WITH BREAKFAST  . glucose blood (RELION GLUCOSE TEST STRIPS) test strip Use as instructed  . hydroxypropyl methylcellulose / hypromellose (ISOPTO TEARS / GONIOVISC) 2.5 % ophthalmic solution Place 1 drop into both eyes 3 (three) times daily as needed for dry eyes.  Marland Kitchen ibuprofen (ADVIL) 800 MG tablet Take 1 tablet (800 mg total) by mouth every 8 (eight) hours as needed.  . insulin glargine (LANTUS) 100 UNIT/ML Solostar Pen Inject 14  Units into the skin at bedtime.  . insulin lispro (HUMALOG KWIKPEN) 100 UNIT/ML KwikPen Inject 3-6 Units into the skin 3 (three) times daily. If glucose is above 90 and you are eating  . Insulin Pen Needle (B-D ULTRAFINE III SHORT PEN) 31G X 8 MM MISC 1 each by Does not apply route as directed. Use as directed twice daily  . Insulin Syringe-Needle U-100 (RELION INSULIN SYR 0.3CC/30G) 30G X 5/16" 0.3 ML MISC Use to inject insulin 2 x a day  . metroNIDAZOLE (FLAGYL) 500 MG tablet Take 1 tablet (500 mg total) by mouth 2 (two) times daily.  . Multiple Vitamin (MULTIVITAMIN WITH MINERALS) TABS tablet Take 1 tablet by mouth daily.  Marland Kitchen oxyCODONE (ROXICODONE) 5 MG immediate release tablet Take 1 tablet (5 mg total) by mouth every 4 (four) hours as needed for severe pain.  . ReliOn Ultra Thin Lancets MISC Use to test glucose 4 times a day  . rosuvastatin (CRESTOR) 10 MG tablet TAKE 1 TABLET BY MOUTH EVERY DAY  . [DISCONTINUED] escitalopram (LEXAPRO) 10 MG tablet Take 10 mg by mouth daily.  . [DISCONTINUED] insulin isophane & regular human (HUMULIN 70/30 MIX) (70-30) 100 UNIT/ML KwikPen Inject 40 Units into the skin 2 (two) times daily.   No facility-administered encounter medications on file as of 09/04/2020.    ALLERGIES: Allergies  Allergen Reactions  . Metformin And Related     Sensitivity to med; stomach issues  . Morphine And Related Itching    VACCINATION STATUS: Immunization History  Administered Date(s) Administered  . PPD Test 10/17/2017    Diabetes She presents for her follow-up diabetic visit. She has type 2 diabetes mellitus. Her disease course has been improving. There are no hypoglycemic associated symptoms. Associated symptoms include blurred vision, polydipsia and polyuria. There are no hypoglycemic complications. Symptoms are improving. There are no diabetic complications. Risk factors for coronary artery disease include diabetes mellitus, dyslipidemia, obesity, sedentary  lifestyle and hypertension. Current diabetic treatment includes insulin injections and oral agent (monotherapy). She is compliant with treatment some of the time. Her weight is increasing steadily. She is following a generally unhealthy diet. When asked about meal planning, she reported none. She has not had a previous visit with a dietitian. She participates in exercise intermittently. Her home blood glucose trend is fluctuating minimally. Her  breakfast blood glucose range is generally >200 mg/dl. Her lunch blood glucose range is generally >200 mg/dl. Her dinner blood glucose range is generally >200 mg/dl. Her bedtime blood glucose range is generally >200 mg/dl. Her overall blood glucose range is >200 mg/dl. (She presents today with her CGM, no logs, showing persistent hyperglycemia.  Her POCT A1c today is 11.6%, decreasing slightly from previous visit of 12%.  She was given a Dexcom sample from her PCP.  He still endorses drinking sweetened beverages such as soda.  She denies any hypoglycemia.  She has questions about the classification of her diabetes.  She now works from home and is adequately engaged to be able to commit to intensified treatment regimen.) An ACE inhibitor/angiotensin II receptor blocker is not being taken. She does not see a podiatrist.Eye exam is current.     Review of systems  Constitutional: + Minimally fluctuating body weight,  current Body mass index is 36.72 kg/m. , no fatigue, no subjective hyperthermia, no subjective hypothermia Eyes: no blurry vision, no xerophthalmia ENT: no sore throat, no nodules palpated in throat, no dysphagia/odynophagia, no hoarseness Cardiovascular: no chest pain, no shortness of breath, no palpitations, no leg swelling Respiratory: no cough, no shortness of breath Gastrointestinal: no nausea/vomiting/diarrhea Musculoskeletal: no muscle/joint aches Skin: no rashes, no hyperemia Neurological: no tremors, no numbness, no tingling, no  dizziness Psychiatric: no depression, no anxiety   Objective:    BP (!) 171/85 (BP Location: Left Arm, Patient Position: Sitting)   Pulse (!) 109   Ht 5' (1.524 m)   Wt 188 lb (85.3 kg)   BMI 36.72 kg/m   Wt Readings from Last 3 Encounters:  09/04/20 188 lb (85.3 kg)  07/22/20 178 lb (80.7 kg)  05/06/20 185 lb 6.4 oz (84.1 kg)    BP Readings from Last 3 Encounters:  09/04/20 (!) 171/85  07/22/20 (!) 180/89  05/06/20 120/72    Physical Exam- Limited  Constitutional:  Body mass index is 36.72 kg/m. , not in acute distress, normal state of mind Eyes:  EOMI, no exophthalmos Neck: Supple Cardiovascular: RRR, no murmers, rubs, or gallops, no edema Respiratory: Adequate breathing efforts, no crackles, rales, rhonchi, or wheezing Musculoskeletal: no gross deformities, strength intact in all four extremities, no gross restriction of joint movements Skin:  no rashes, no hyperemia Neurological: no tremor with outstretched hands    CMP ( most recent) CMP     Component Value Date/Time   NA 135 01/23/2020 0941   K 4.2 01/23/2020 0941   CL 99 01/23/2020 0941   CO2 19 (L) 01/23/2020 0941   GLUCOSE 243 (H) 01/23/2020 0941   GLUCOSE 177 (H) 04/02/2019 1042   BUN 15 01/23/2020 0941   CREATININE 0.72 01/23/2020 0941   CALCIUM 9.6 01/23/2020 0941   PROT 7.2 01/23/2020 0941   ALBUMIN 4.4 01/23/2020 0941   AST 13 01/23/2020 0941   ALT 18 01/23/2020 0941   ALKPHOS 89 01/23/2020 0941   BILITOT <0.2 01/23/2020 0941   GFRNONAA 114 01/23/2020 0941   GFRAA 131 01/23/2020 0941     Diabetic Labs (most recent): Lab Results  Component Value Date   HGBA1C 11.6 (A) 09/04/2020   HGBA1C 12.0 (A) 04/27/2020   HGBA1C 10.6 (A) 01/24/2020     Lipid Panel ( most recent) Lipid Panel     Component Value Date/Time   CHOL 238 (H) 01/23/2020 0941   TRIG 386 (H) 01/23/2020 0941   HDL 32 (L) 01/23/2020 0941   CHOLHDL 7.8 04/02/2019 1042  VLDL UNABLE TO CALCULATE IF TRIGLYCERIDE OVER 400  mg/dL 16/03/9603 5409   LDLCALC 136 (H) 01/23/2020 0941   LDLDIRECT 133.0 (H) 04/02/2019 1042       Assessment & Plan:   1)  Uncontrolled type 2 diabetes  - Madalina L Casa has currently uncontrolled symptomatic type 2 DM since 30 years of age.  She presents today with her CGM, no logs, showing persistent hyperglycemia.  Her POCT A1c today is 11.6%, decreasing slightly from previous visit of 12%.  She was given a Dexcom sample from her PCP.  He still endorses drinking sweetened beverages such as soda.  She denies any hypoglycemia.  She has questions about the classification of her diabetes.  She now works from home and is adequately engaged to be able to commit to intensified treatment regimen.  Analysis of her CGM shows TIR 30%, TAR 60% (16% being very high range).  She has a history of diabetic ketoacidosis which required hospitalization in October 2019.  She remains unengaged.  Her compliance is questionable at this point.  Her recent labs are reviewed with her.    - I had a long discussion with  her about the progressive nature of diabetes and the pathology behind its complications. -Her care is complicated by noncompliance/nonadherence, she does not report gross complications from diabetes yet, however she remains at a high risk for more acute and chronic complications which include CAD, CVA, CKD, retinopathy, and neuropathy. These are all discussed in detail with her.  - Nutritional counseling repeated at each appointment due to patients tendency to fall back in to old habits.  - The patient admits there is a room for improvement in their diet and drink choices. -  Suggestion is made for the patient to avoid simple carbohydrates from their diet including Cakes, Sweet Desserts / Pastries, Ice Cream, Soda (diet and regular), Sweet Tea, Candies, Chips, Cookies, Sweet Pastries,  Store Bought Juices, Alcohol in Excess of  1-2 drinks a day, Artificial Sweeteners, Coffee Creamer, and  "Sugar-free" Products. This will help patient to have stable blood glucose profile and potentially avoid unintended weight gain.   - I encouraged the patient to switch to  unprocessed or minimally processed complex starch and increased protein intake (animal or plant source), fruits, and vegetables.   - Patient is advised to stick to a routine mealtimes to eat 3 meals  a day and avoid unnecessary snacks ( to snack only to correct hypoglycemia).  - I have approached her with the following individualized plan to manage diabetes and patient agrees:   -She engaged better since last visit and has benefited greatly from CGM.  This patient would ideally need intensive treatment with basal/bolus insulin in order for her to control diabetes to target.  -Given her recent engagement, we discussed switching to basal/bolus insulin and she agreed.  She is advised to start Lantus 14 units SQ nightly (samples of Tresiba provided from office) and start Humalog 3-6 units TID with meals if glucose is above 90 and she is eating (samples of Novolog provided from office).  Specific instructions on how to titrate insulin dose based on glucose readings given to patient in writing.  She demonstrated her understanding on how to use SSI chart at today's visit.  -She can continue Glipizide 10 mg XL daily with breakfast for now.  -She is encouraged to continue monitoring blood glucose 4 times daily, before meals and before bed, and to call the clinic if she has readings less than  70 or greater than 300 for 3 tests in a row.   - she is warned not to take insulin without proper monitoring per orders.  - she is not yet a good candidate for incretin therapy due to elevated triglycerides increasing her risk of pancreatitis.   As for the classification of her diabetes, further testing with anti-GAD testing will be postponed until A1c reaches 8% to ensure accuracy of results.  2) Blood Pressure /Hypertension:  Her blood  pressure is not controlled to target.  She is not not taking any antihypertensive medications for now.  She will be considered for intervention if it remains above 130/80 next visit.  3) Lipids/Hyperlipidemia:  Her most recent lipid panel from 01/23/20 shows uncontrolled LDL of 136 and elevated triglycerides of 386. She is advised to continue Crestor 10 mg po daily at bedtime and Fenofibrate 48 mg po daily.  Side effects and precautions discussed with her.  4) Vitamin D deficiency:  Her most recent vitamin D level from 04/02/19 was 21.8.  She is currently taking OTC Vitamin D3 5000 units daily.      - she is advised to maintain close follow up with Patient, No Pcp Per for primary care needs, as well as her other providers for optimal and coordinated care.  - Time spent on this patient care encounter:  40 min, of which > 50% was spent in  counseling and the rest reviewing her blood glucose logs , discussing her hypoglycemia and hyperglycemia episodes, reviewing her current and  previous labs / studies  ( including abstraction from other facilities) and medications  doses and developing a  long term treatment plan and documenting her care.   Please refer to Patient Instructions for Blood Glucose Monitoring and Insulin/Medications Dosing Guide"  in media tab for additional information. Please  also refer to " Patient Self Inventory" in the Media  tab for reviewed elements of pertinent patient history.  Chauncy Leanominique L Difatta participated in the discussions, expressed understanding, and voiced agreement with the above plans.  All questions were answered to her satisfaction. she is encouraged to contact clinic should she have any questions or concerns prior to her return visit.   Follow up plan: - Return in about 2 weeks (around 09/18/2020) for Diabetes follow up, Bring glucometer and logs, No previsit labs, ABI next visit.  Ronny BaconWhitney Caitlynn Ju, [facility] Va Medical CenterFNP-BC [facility] [address] [place], [place] [address] Phone: [phone] Fax: [phone]  09/04/2020, 11:56 AM

## 2020-09-10 ENCOUNTER — Telehealth: Payer: Self-pay

## 2020-09-10 NOTE — Telephone Encounter (Signed)
I reviewed her Dexcom report.  Have her increase her Lantus (or Evaristo Bury since this is the sample I gave her) to 20 units SQ nightly until she sees me next week.  Advise her to continue working on diet and exercise in the interim.

## 2020-09-10 NOTE — Telephone Encounter (Signed)
Pt is getting ready to set up her dexcom to where you can see her readings. She would like you to view that soon because she says her readings are in the 300's

## 2020-09-10 NOTE — Telephone Encounter (Signed)
Returned call to patient, verbalized understanding. 

## 2020-09-18 ENCOUNTER — Ambulatory Visit: Payer: No Typology Code available for payment source | Admitting: Nurse Practitioner

## 2020-09-18 NOTE — Patient Instructions (Incomplete)

## 2020-09-21 ENCOUNTER — Ambulatory Visit: Payer: No Typology Code available for payment source | Admitting: Nurse Practitioner

## 2020-09-24 IMAGING — CT CT ABD-PELV W/ CM
2 of 4 series · 16 of 46 positions shown, 18 images · IV contrast (Isovue)
Comparison: 08/25/2016 and 09/11/2010

CLINICAL DATA: Mid to upper and lower abdominal pain 3 days with
nausea, vomiting and diarrhea.

EXAM:
CT ABDOMEN AND PELVIS WITH CONTRAST
TECHNIQUE: Multidetector CT imaging of the abdomen and pelvis was performed
using the standard protocol following bolus administration of
intravenous contrast.
CONTRAST:  75mL [id number] IOPAMIDOL ([id number]-[id number]) INJECTION 61%

[Series 2: axial st · axial · 0.73mm/px · z∈[-320,+80]mm · 13 of 89 slices shown, 15 images]
[im 5/89  soft-tissue]
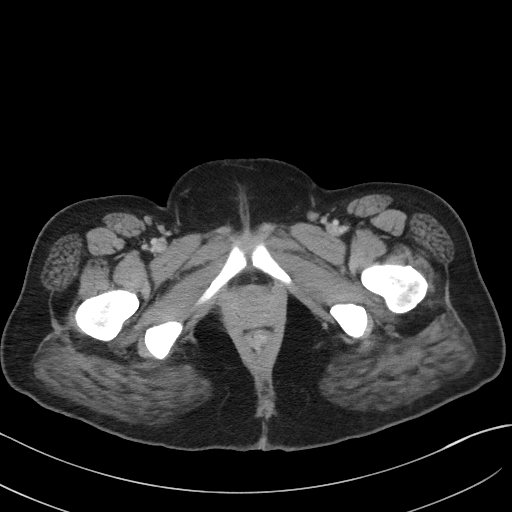
[im 5/89  bone]
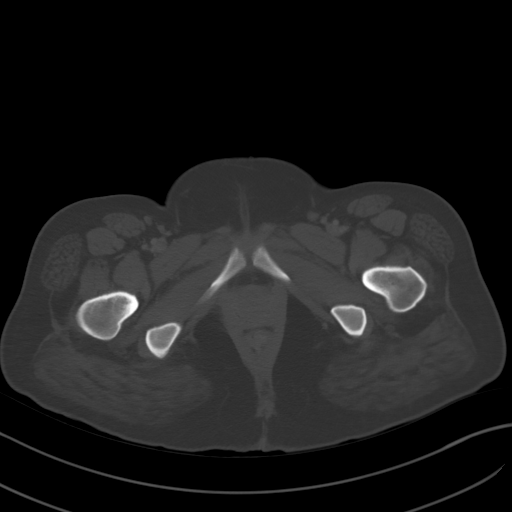
[im 13/89  soft-tissue]
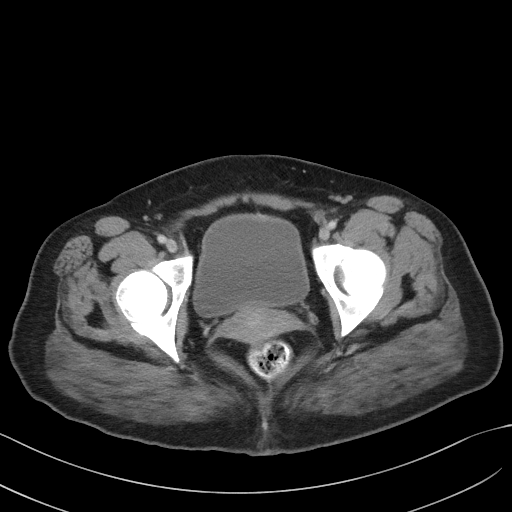
[im 21/89  soft-tissue]
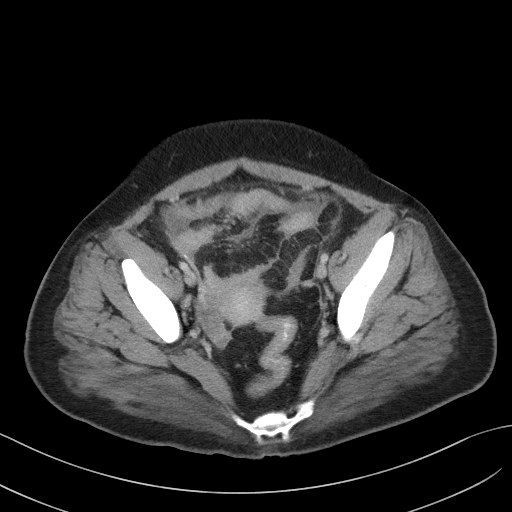
[im 25/89  soft-tissue]
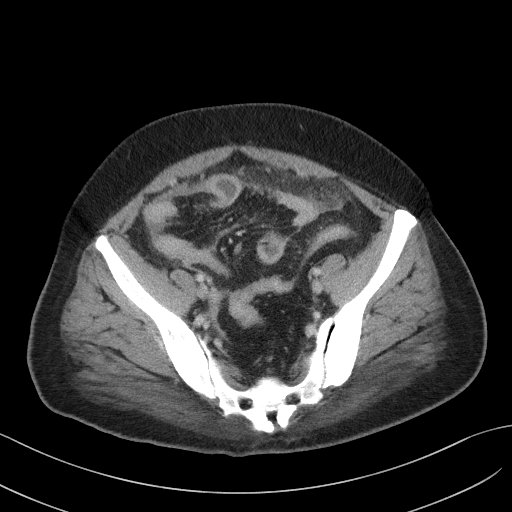
[im 33/89  soft-tissue]
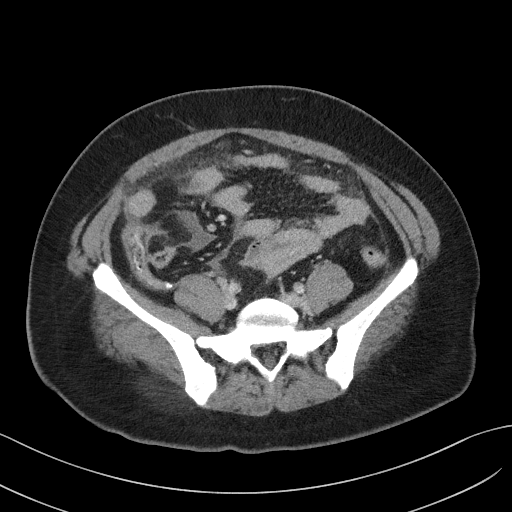
[im 37/89  soft-tissue]
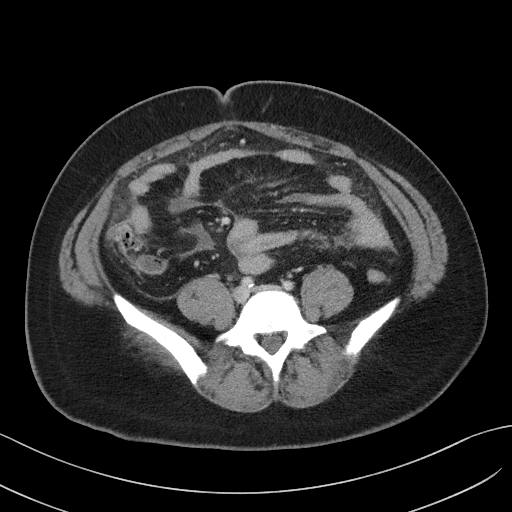
[im 45/89  soft-tissue]
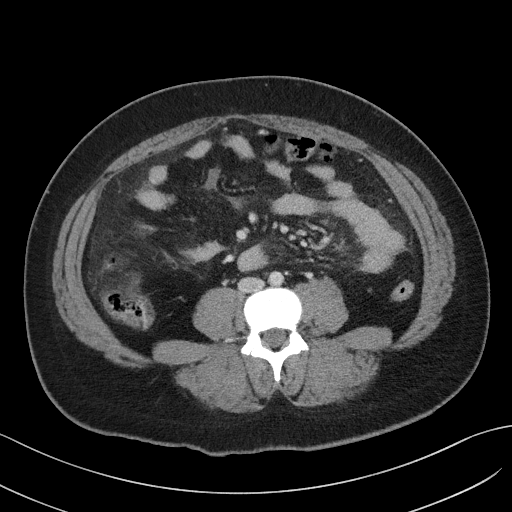
[im 53/89  soft-tissue]
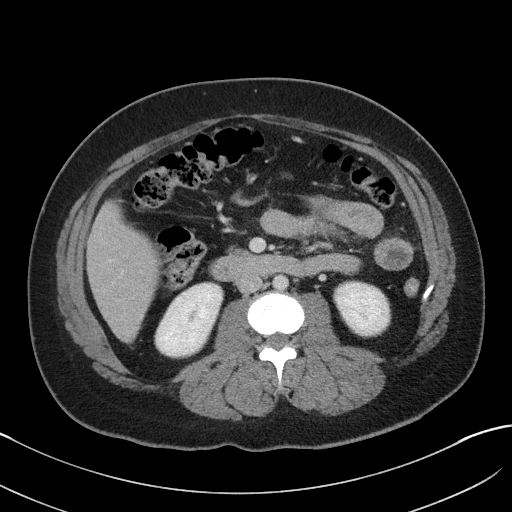
[im 57/89  soft-tissue]
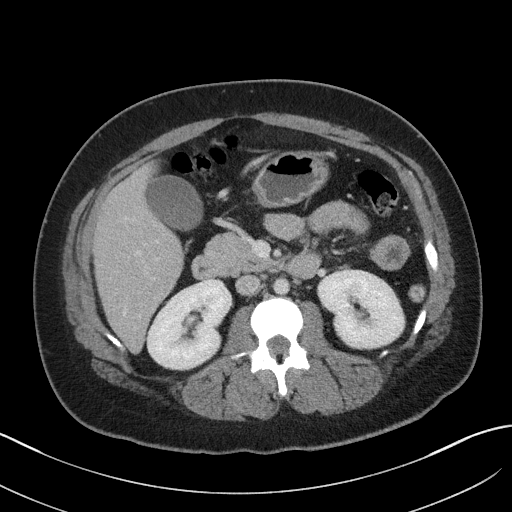
[im 57/89  bone]
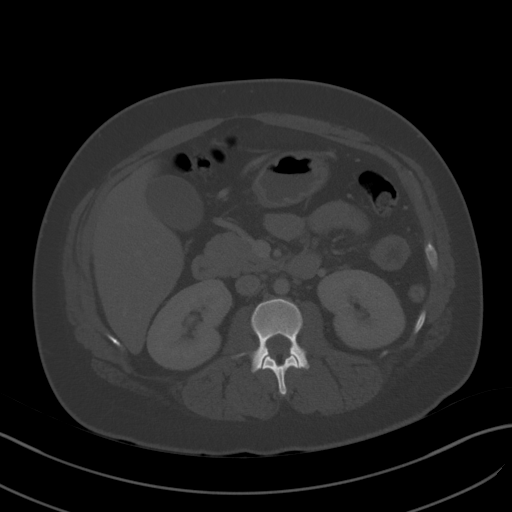
[im 65/89  soft-tissue]
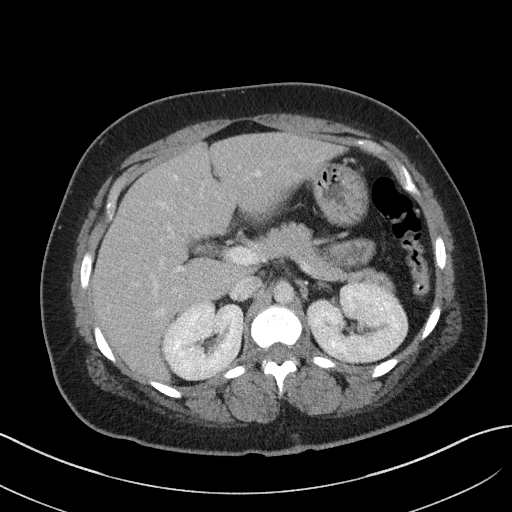
[im 69/89  soft-tissue]
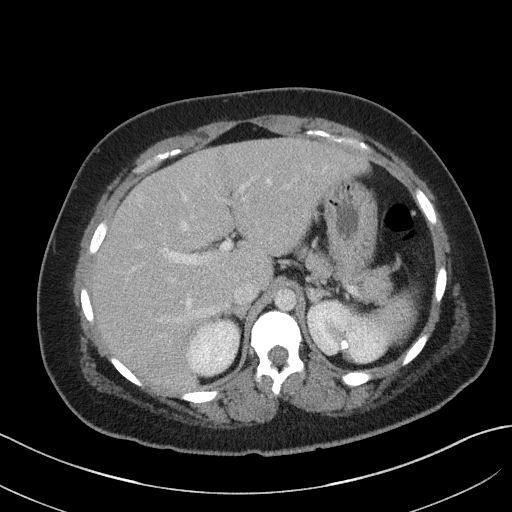
[im 77/89  soft-tissue]
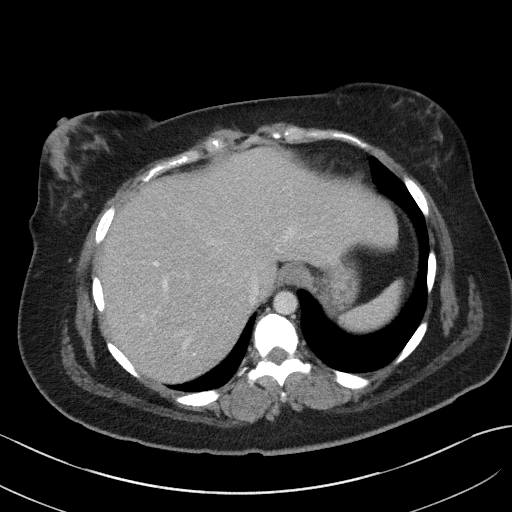
[im 85/89  soft-tissue]
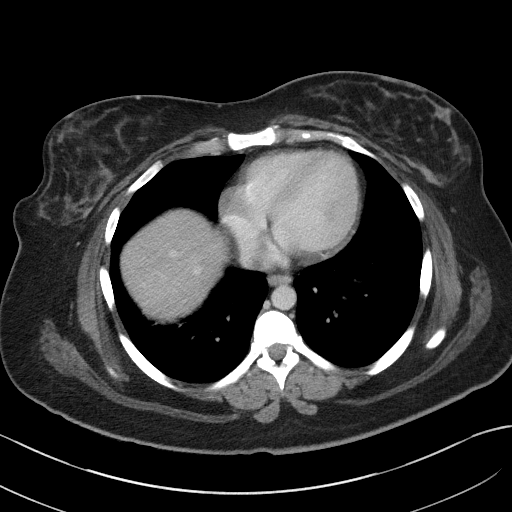

[Series 5: coronal st · coronal · 0.77mm/px · 3 of 90 slices shown]
[im 30/90  soft-tissue]
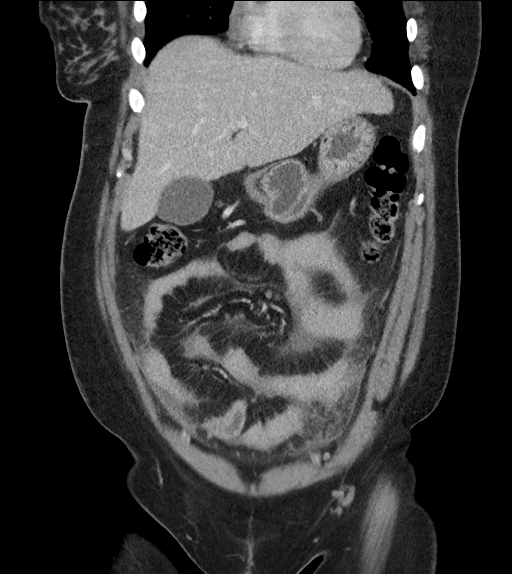
[im 40/90  soft-tissue]
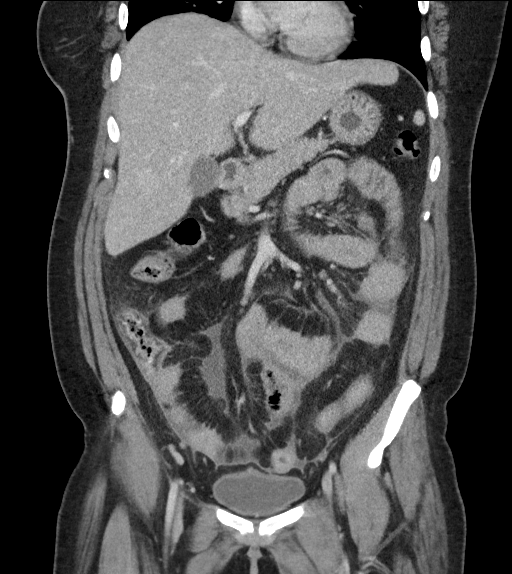
[im 50/90  soft-tissue]
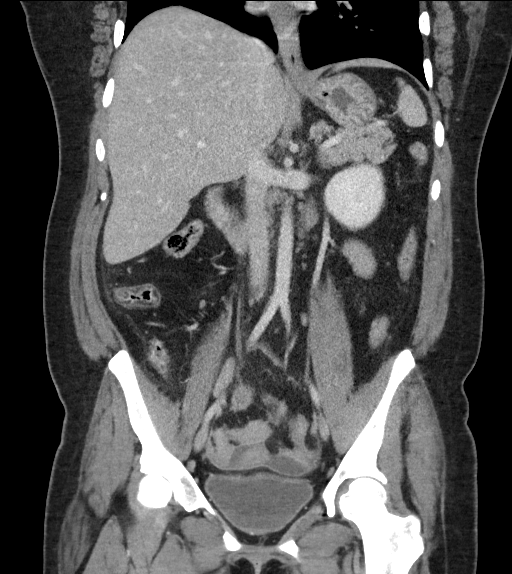

[16 of 46 positions shown; findings below may reference images not displayed]

FINDINGS: Lower chest: Minimal linear atelectasis right base.

Hepatobiliary: Liver, gallbladder and biliary tree are normal.

Pancreas: Normal.

Spleen: Normal

Adrenals/Urinary Tract: Adrenal glands are normal. Kidneys normal in
size with mild focal renal cortical scarring over the upper pole
left kidney with an associated 6 mm calcification unchanged. Ureters
and bladder are normal.

Stomach/Bowel: Stomach is normal. There are several thick-walled
small bowel loops in the left upper quadrant. There are a few
thick-walled small bowel loops over the lower abdomen/pelvis. There
is patchy mesenteric fluid. No dilated small bowel loops. Previous
appendectomy. Air and stool over the right colon and transverse
colon as the descending and rectosigmoid colon are decompressed.

Vascular/Lymphatic: Normal.

Reproductive: Uterus and ovaries are normal.

Other: None.

Musculoskeletal: Within normal.
IMPRESSION: Several thick-walled small bowel loops over the left upper quadrant
and lower abdomen/pelvis. Associated patchy mesenteric
fluid/ascites. No free peritoneal air. Findings are likely due to
regional enteritis of inflammatory or infectious nature.

6 mm nonobstructing left renal stone with mild left renal cortical
scarring unchanged.

## 2020-09-25 IMAGING — DX DG CHEST 2V
2 series · 2 of 2 positions shown · non-contrast
Comparison: None.

CLINICAL DATA: 19-year-old female with leukocytosis.

EXAM:
CHEST - 2 VIEW

[chest lat]
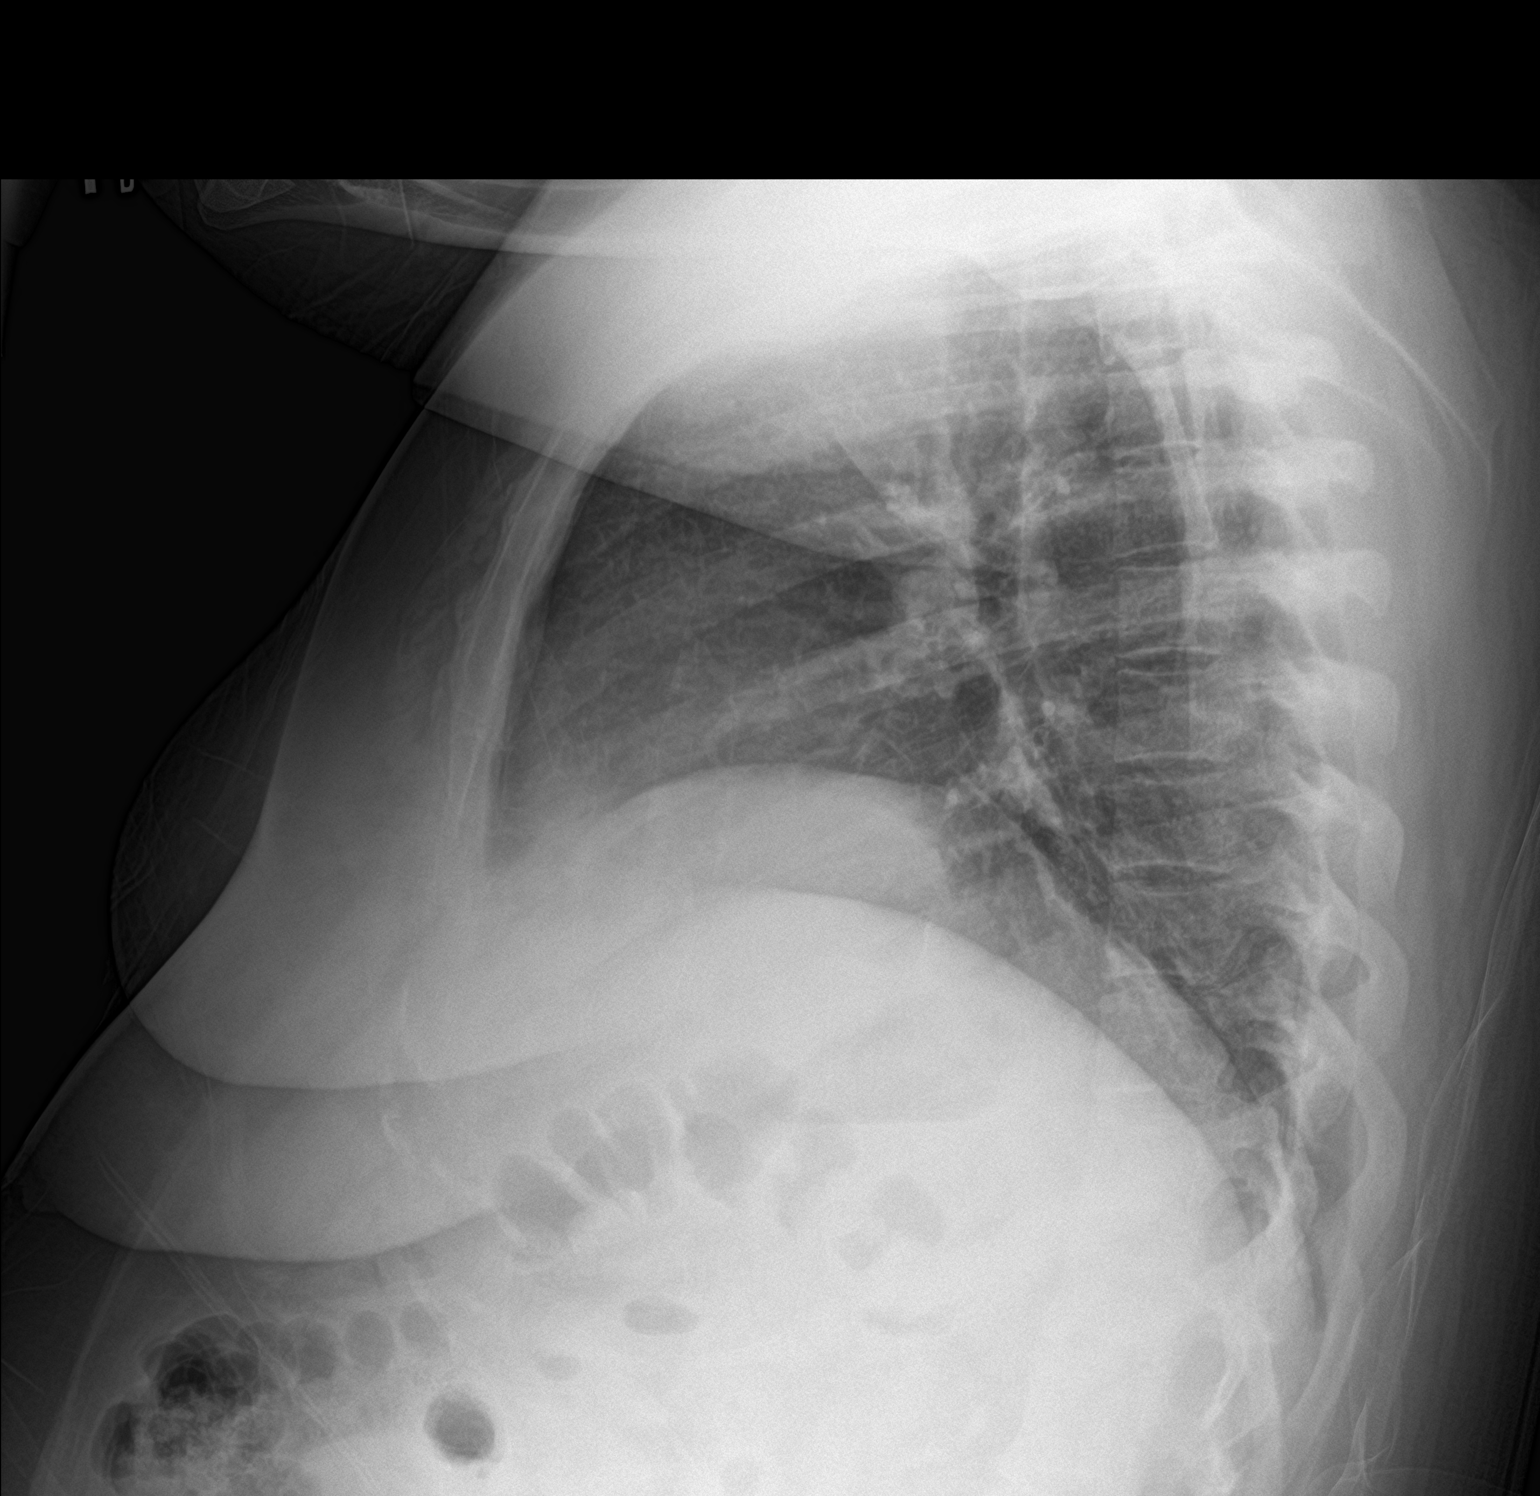

[chest ap]
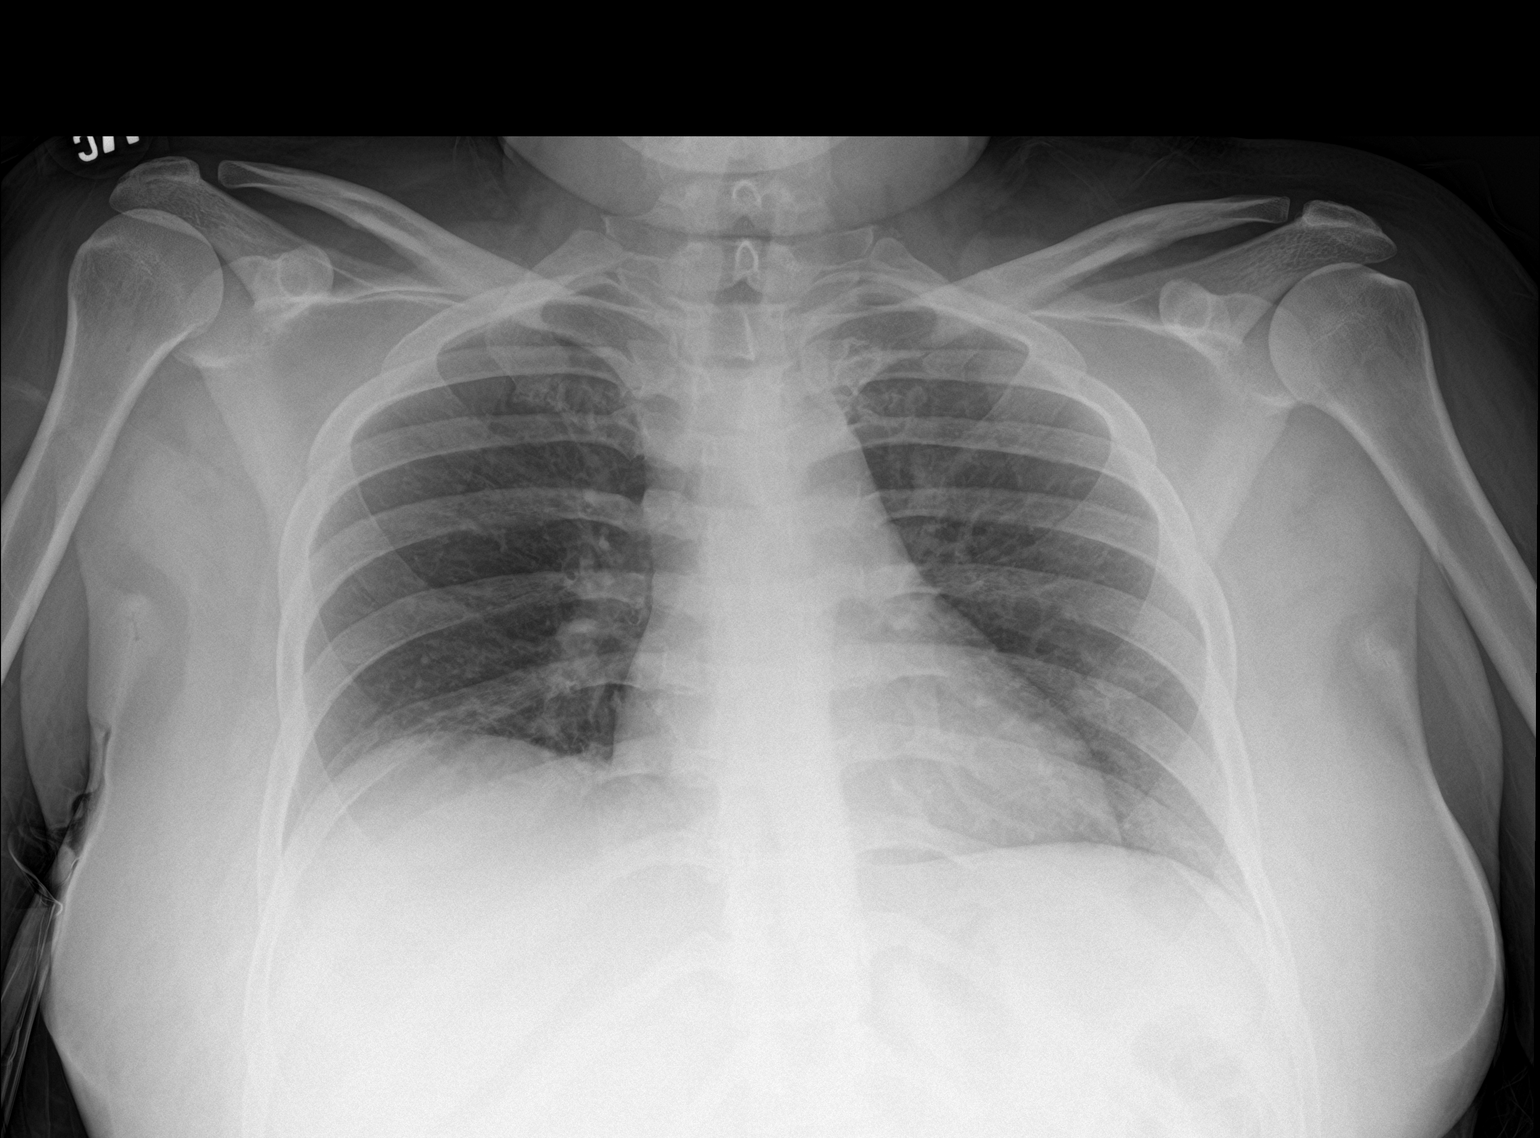

[2 of 2 positions shown; findings below may reference images not displayed]

FINDINGS: The heart size and mediastinal contours are within normal limits.
Both lungs are clear. The visualized skeletal structures are
unremarkable.
IMPRESSION: No active cardiopulmonary disease.

## 2020-09-28 ENCOUNTER — Other Ambulatory Visit: Payer: Self-pay

## 2020-09-28 DIAGNOSIS — E1165 Type 2 diabetes mellitus with hyperglycemia: Secondary | ICD-10-CM

## 2020-09-28 MED ORDER — GLIPIZIDE ER 10 MG PO TB24: 10.0000 mg | ORAL_TABLET | Freq: Every day | ORAL | 1 refills | Status: DC

## 2020-09-28 MED ORDER — INSULIN GLARGINE 100 UNIT/ML SOLOSTAR PEN: 14.0000 [IU] | PEN_INJECTOR | Freq: Every day | SUBCUTANEOUS | 1 refills | Status: DC

## 2020-09-28 MED ORDER — INSULIN LISPRO (1 UNIT DIAL) 100 UNIT/ML (KWIKPEN): 3.0000 [IU] | PEN_INJECTOR | Freq: Three times a day (TID) | SUBCUTANEOUS | 1 refills | Status: DC

## 2020-11-05 ENCOUNTER — Telehealth: Payer: Self-pay | Admitting: Advanced Practice Midwife

## 2020-11-05 ENCOUNTER — Other Ambulatory Visit: Payer: Self-pay | Admitting: Advanced Practice Midwife

## 2020-11-05 MED ORDER — METRONIDAZOLE 500 MG PO TABS: 500.0000 mg | ORAL_TABLET | Freq: Two times a day (BID) | ORAL | 0 refills | Status: DC

## 2020-11-05 MED ORDER — FLUCONAZOLE 100 MG PO TABS: ORAL_TABLET | ORAL | 1 refills | Status: DC

## 2020-11-05 NOTE — Telephone Encounter (Signed)
Patient wants a refill on metroNIDAZOLE (FLAGYL) 500 MG tablet and  Fluconazole 100 mg. Patient stated that she's on an antibotic from the Dentist and she knows a yeast infection is coming on, per patient.

## 2020-11-05 NOTE — Telephone Encounter (Signed)
done

## 2020-11-05 NOTE — Telephone Encounter (Signed)
Patient states she has been taking an antibiotic prescribed by her dentist and feels like she is getting irritated.  She is diabetic and usually gets treatment for BV and yeast at the same time.  Pt is requesting Metronidazole and Diflucan be sent to her pharmacy.

## 2020-11-09 ENCOUNTER — Ambulatory Visit (INDEPENDENT_AMBULATORY_CARE_PROVIDER_SITE_OTHER): Payer: No Typology Code available for payment source | Admitting: Nurse Practitioner

## 2020-11-09 ENCOUNTER — Encounter: Payer: Self-pay | Admitting: Nurse Practitioner

## 2020-11-09 ENCOUNTER — Other Ambulatory Visit: Payer: Self-pay

## 2020-11-09 VITALS — BP 161/94 | HR 80 | Ht 60.0 in | Wt 186.0 lb

## 2020-11-09 DIAGNOSIS — E559 Vitamin D deficiency, unspecified: Secondary | ICD-10-CM | POA: Diagnosis not present

## 2020-11-09 DIAGNOSIS — E782 Mixed hyperlipidemia: Secondary | ICD-10-CM | POA: Diagnosis not present

## 2020-11-09 DIAGNOSIS — E1165 Type 2 diabetes mellitus with hyperglycemia: Secondary | ICD-10-CM

## 2020-11-09 MED ORDER — DEXCOM G6 SENSOR MISC: 3 refills | Status: DC

## 2020-11-09 MED ORDER — DEXCOM G6 TRANSMITTER MISC: 3 refills | Status: DC

## 2020-11-09 MED ORDER — INSULIN GLARGINE 100 UNIT/ML SOLOSTAR PEN: 35.0000 [IU] | PEN_INJECTOR | Freq: Every day | SUBCUTANEOUS | 3 refills | Status: DC

## 2020-11-09 MED ORDER — INSULIN LISPRO (1 UNIT DIAL) 100 UNIT/ML (KWIKPEN): 8.0000 [IU] | PEN_INJECTOR | Freq: Three times a day (TID) | SUBCUTANEOUS | 3 refills | Status: DC

## 2020-11-09 NOTE — Patient Instructions (Signed)

## 2020-11-09 NOTE — Progress Notes (Signed)
11/09/2020, 3:35 PM  Endocrinology follow-up note   Subjective:    Patient ID: Ann Richards, female    DOB: 1991/01/18.  Ann Richards is being seen in follow-up for management of currently uncontrolled symptomatic diabetes requested by  Patient, No Pcp Per (Inactive).   Past Medical History:  Diagnosis Date  . Anxiety   . Depression   . Diabetes mellitus without complication (HCC)   . Insomnia     Past Surgical History:  Procedure Laterality Date  . LAPAROSCOPIC APPENDECTOMY N/A 08/25/2016   Procedure: APPENDECTOMY LAPAROSCOPIC;  Surgeon: Franky Macho, MD;  Location: [facility];  Service: General;  Laterality: N/A;    Social History   Socioeconomic History  . Marital status: Single    Spouse name: Not on file  . Number of children: Not on file  . Years of education: Not on file  . Highest education level: Not on file  Occupational History  . Not on file  Tobacco Use  . Smoking status: Former Smoker    Packs/day: 0.25    Years: 1.00    Pack years: 0.25    Types: Cigarettes    Start date: 03/14/2008    Quit date: 05/14/2009    Years since quitting: 11.4  . Smokeless tobacco: Never Used  Vaping Use  . Vaping Use: Never used  Substance and Sexual Activity  . Alcohol use: Yes    Alcohol/week: 0.0 standard drinks    Comment: ocassional  . Drug use: No  . Sexual activity: Not Currently    Partners: Male    Birth control/protection: Abstinence  Other Topics Concern  . Not on file  Social History Narrative  . Not on file   Social Determinants of Health   Financial Resource Strain: Low Risk   . Difficulty of Paying Living Expenses: Not hard at all  Food Insecurity: No Food Insecurity  . Worried About Programme researcher, broadcasting/film/video in the Last Year: Never true  . Ran Out of Food in the Last Year: Never true  Transportation Needs: No  Transportation Needs  . Lack of Transportation (Medical): No  . Lack of Transportation (Non-Medical): No  Physical Activity: Insufficiently Active  . Days of Exercise per Week: 2 days  . Minutes of Exercise per Session: 60 min  Stress: Stress Concern Present  . Feeling of Stress : Very much  Social Connections: Socially Isolated  . Frequency of Communication with Friends and Family: More than three times a week  . Frequency of Social Gatherings with Friends and Family: Three times a week  . Attends Religious Services: Never  . Active Member of Clubs or Organizations: No  . Attends Banker Meetings: Never  . Marital Status: Never married    Family History  Problem Relation Age of Onset  . Diabetes Father     Outpatient Encounter Medications as of 11/09/2020  Medication Sig  . Continuous Blood Gluc Sensor (DEXCOM G6 SENSOR) MISC Change sensor  every 10 days as instructed  . Continuous Blood Gluc Transmit (DEXCOM G6 TRANSMITTER) MISC Change transmitter every 90 days  . albuterol (VENTOLIN HFA) 108 (90 Base) MCG/ACT inhaler INHALE TWO PUFFS INTO THE LUNGS EVERY 4 HOURS AS NEEDED  . ALPRAZolam (XANAX) 0.5 MG tablet Take 0.5 mg by mouth at bedtime as needed for anxiety.  . APPLE CIDER VINEGAR PO Take by mouth.  . cephALEXin (KEFLEX) 500 MG capsule Take 500 mg by mouth every 8 (eight) hours.  . Cholecalciferol (VITAMIN D3) 125 MCG (5000 UT) CAPS Take 1 capsule (5,000 Units total) by mouth daily.  Marland Kitchen doxepin (SINEQUAN) 10 MG capsule Take 10 mg by mouth at bedtime.  Marland Kitchen escitalopram (LEXAPRO) 20 MG tablet Take 20 mg by mouth daily.  . fenofibrate (TRICOR) 48 MG tablet TAKE 1 TABLET BY MOUTH EVERY DAY  . fluconazole (DIFLUCAN) 100 MG tablet Take 1 daily for 5 days  . glipiZIDE (GLUCOTROL XL) 10 MG 24 hr tablet Take 1 tablet (10 mg total) by mouth daily with breakfast.  . glucose blood (RELION GLUCOSE TEST STRIPS) test strip Use as instructed  . hydroxypropyl methylcellulose /  hypromellose (ISOPTO TEARS / GONIOVISC) 2.5 % ophthalmic solution Place 1 drop into both eyes 3 (three) times daily as needed for dry eyes.  Marland Kitchen ibuprofen (ADVIL) 800 MG tablet Take 1 tablet (800 mg total) by mouth every 8 (eight) hours as needed.  . insulin glargine (LANTUS) 100 UNIT/ML Solostar Pen Inject 35 Units into the skin at bedtime.  . insulin lispro (HUMALOG KWIKPEN) 100 UNIT/ML KwikPen Inject 8-14 Units into the skin 3 (three) times daily. If glucose is above 90 and you are eating  . Insulin Pen Needle (B-D ULTRAFINE III SHORT PEN) 31G X 8 MM MISC 1 each by Does not apply route as directed. Use as directed twice daily  . Insulin Syringe-Needle U-100 (RELION INSULIN SYR 0.3CC/30G) 30G X 5/16" 0.3 ML MISC Use to inject insulin 2 x a day  . metroNIDAZOLE (FLAGYL) 500 MG tablet Take 1 tablet (500 mg total) by mouth 2 (two) times daily.  . Multiple Vitamin (MULTIVITAMIN WITH MINERALS) TABS tablet Take 1 tablet by mouth daily.  Marland Kitchen oxyCODONE (ROXICODONE) 5 MG immediate release tablet Take 1 tablet (5 mg total) by mouth every 4 (four) hours as needed for severe pain.  . ReliOn Ultra Thin Lancets MISC Use to test glucose 4 times a day  . rosuvastatin (CRESTOR) 10 MG tablet TAKE 1 TABLET BY MOUTH EVERY DAY  . [DISCONTINUED] insulin glargine (LANTUS) 100 UNIT/ML Solostar Pen Inject 14 Units into the skin at bedtime.  . [DISCONTINUED] insulin lispro (HUMALOG KWIKPEN) 100 UNIT/ML KwikPen Inject 3-6 Units into the skin 3 (three) times daily. If glucose is above 90 and you are eating   No facility-administered encounter medications on file as of 11/09/2020.    ALLERGIES: Allergies  Allergen Reactions  . Metformin And Related     Sensitivity to med; stomach issues  . Morphine And Related Itching    VACCINATION STATUS: Immunization History  Administered Date(s) Administered  . PPD Test 10/17/2017    Diabetes She presents for her follow-up diabetic visit. She has type 2 diabetes mellitus. Her  disease course has been improving. There are no hypoglycemic associated symptoms. Associated symptoms include fatigue. Pertinent negatives for diabetes include no blurred vision, no polydipsia and no polyuria. There are no hypoglycemic complications. Symptoms are improving. There are no diabetic complications. Risk factors for coronary artery disease include diabetes mellitus, dyslipidemia,  obesity, sedentary lifestyle and hypertension. Current diabetic treatment includes oral agent (monotherapy) and intensive insulin program. She is compliant with treatment most of the time. Her weight is increasing steadily. She is following a generally unhealthy diet. When asked about meal planning, she reported none. She has not had a previous visit with a dietitian. She participates in exercise intermittently. Her home blood glucose trend is fluctuating minimally. Her breakfast blood glucose range is generally >200 mg/dl. Her lunch blood glucose range is generally >200 mg/dl. Her dinner blood glucose range is generally >200 mg/dl. Her bedtime blood glucose range is generally >200 mg/dl. Her overall blood glucose range is >200 mg/dl. (She presents today with her CGM, no logs, showing persistent hyperglycemia yet somewhat improved since starting prandial insulin.  She admitted she self increased her Humalog to 8-10 units TID with meals.  There are no episodes of hypoglycemia reported or documented.  Analysis of her CGM shows TIR 15%, TAR 84%, TBR < 1%.) An ACE inhibitor/angiotensin II receptor blocker is not being taken. She does not see a podiatrist.Eye exam is current.     Review of systems  Constitutional: + steadily increasing body weight,  current Body mass index is 36.33 kg/m. , + fatigue, no subjective hyperthermia, no subjective hypothermia Eyes: no blurry vision, no xerophthalmia ENT: no sore throat, no nodules palpated in throat, no dysphagia/odynophagia, no hoarseness Cardiovascular: no chest pain, no  shortness of breath, no palpitations, no leg swelling Respiratory: no cough, no shortness of breath Gastrointestinal: no nausea/vomiting/diarrhea Musculoskeletal: no muscle/joint aches Skin: no rashes, no hyperemia Neurological: no tremors, no numbness, no tingling, no dizziness Psychiatric: no depression, no anxiety   Objective:    BP (!) 161/94   Pulse 80   Ht 5' (1.524 m)   Wt 186 lb (84.4 kg)   BMI 36.33 kg/m   Wt Readings from Last 3 Encounters:  11/09/20 186 lb (84.4 kg)  09/04/20 188 lb (85.3 kg)  07/22/20 178 lb (80.7 kg)    BP Readings from Last 3 Encounters:  11/09/20 (!) 161/94  09/04/20 (!) 171/85  07/22/20 (!) 180/89      Physical Exam- Limited  Constitutional:  Body mass index is 36.33 kg/m. , not in acute distress, normal state of mind Eyes:  EOMI, no exophthalmos Neck: Supple Cardiovascular: RRR, no murmurs, rubs, or gallops, no edema Respiratory: Adequate breathing efforts, no crackles, rales, rhonchi, or wheezing Musculoskeletal: no gross deformities, strength intact in all four extremities, no gross restriction of joint movements Skin:  no rashes, no hyperemia Neurological: no tremor with outstretched hands    POCT ABI Results 11/09/20   Right ABI:  1.09      Left ABI:  1.02  Right leg systolic / diastolic: 176/100 mmHg Left leg systolic / diastolic: 165/89 mmHg  Arm systolic / diastolic: 161/94 mmHG  Detailed report will be scanned into patient chart.   Foot exam:   No rashes, ulcers, cuts, calluses, onychodystrophy.   Good pulses bilat.  Good sensation to 10 g monofilament bilat.   CMP ( most recent) CMP     Component Value Date/Time   NA 135 01/23/2020 0941   K 4.2 01/23/2020 0941   CL 99 01/23/2020 0941   CO2 19 (L) 01/23/2020 0941   GLUCOSE 243 (H) 01/23/2020 0941   GLUCOSE 177 (H) 04/02/2019 1042   BUN 15 01/23/2020 0941   CREATININE 0.72 01/23/2020 0941   CALCIUM 9.6 01/23/2020 0941   PROT 7.2 01/23/2020 0941    ALBUMIN  4.4 01/23/2020 0941   AST 13 01/23/2020 0941   ALT 18 01/23/2020 0941   ALKPHOS 89 01/23/2020 0941   BILITOT <0.2 01/23/2020 0941   GFRNONAA 114 01/23/2020 0941   GFRAA 131 01/23/2020 0941     Diabetic Labs (most recent): Lab Results  Component Value Date   HGBA1C 11.6 (A) 09/04/2020   HGBA1C 12.0 (A) 04/27/2020   HGBA1C 10.6 (A) 01/24/2020     Lipid Panel ( most recent) Lipid Panel     Component Value Date/Time   CHOL 238 (H) 01/23/2020 0941   TRIG 386 (H) 01/23/2020 0941   HDL 32 (L) 01/23/2020 0941   CHOLHDL 7.8 04/02/2019 1042   VLDL UNABLE TO CALCULATE IF TRIGLYCERIDE OVER 400 mg/dL 21/30/8657 8469   LDLCALC 136 (H) 01/23/2020 0941   LDLDIRECT 133.0 (H) 04/02/2019 1042       Assessment & Plan:   1)  Uncontrolled type 2 diabetes  - Ann Richards has currently uncontrolled symptomatic type 2 DM since 30 years of age.  She presents today with her CGM, no logs, showing persistent hyperglycemia yet somewhat improved since starting prandial insulin.  She admitted she self increased her Humalog to 8-10 units TID with meals.  There are no episodes of hypoglycemia reported or documented.  Analysis of her CGM shows TIR 15%, TAR 84%, TBR < 1%.  She has a history of diabetic ketoacidosis which required hospitalization in October 2019.  She has since re-engaged in her diabetes management.  Her recent labs are reviewed with her.    - I had a long discussion with  her about the progressive nature of diabetes and the pathology behind its complications. -Her care is complicated by noncompliance/nonadherence, she does not report gross complications from diabetes yet, however she remains at a high risk for more acute and chronic complications which include CAD, CVA, CKD, retinopathy, and neuropathy. These are all discussed in detail with her.  - Nutritional counseling repeated at each appointment due to patients tendency to fall back in to old habits.  - The  patient admits there is a room for improvement in their diet and drink choices. -  Suggestion is made for the patient to avoid simple carbohydrates from their diet including Cakes, Sweet Desserts / Pastries, Ice Cream, Soda (diet and regular), Sweet Tea, Candies, Chips, Cookies, Sweet Pastries, Store Bought Juices, Alcohol in Excess of 1-2 drinks a day, Artificial Sweeteners, Coffee Creamer, and "Sugar-free" Products. This will help patient to have stable blood glucose profile and potentially avoid unintended weight gain.   - I encouraged the patient to switch to unprocessed or minimally processed complex starch and increased protein intake (animal or plant source), fruits, and vegetables.   - Patient is advised to stick to a routine mealtimes to eat 3 meals a day and avoid unnecessary snacks (to snack only to correct hypoglycemia).  - I have approached her with the following individualized plan to manage diabetes and patient agrees:   -She engaged better since last visit and has benefited greatly from CGM.  -She has done well with basal/bolus regimen.  Given her persistent hyperglycemia, she will tolerate increase in her Lantus to 35 units SQ nightly and increase her Humalog to 8-14 units TID with meals if glucose is above 90 and she is eating (specific instructions on how to titrate insulin dose based on glucose readings given to patient in writing). She can continue her Glipizide 10 mg XL daily with breakfast.     -She  is encouraged to continue monitoring blood glucose 4 times daily (using her CGM), before meals and before bed, and to call the clinic if she has readings less than 70 or greater than 300 for 3 tests in a row.   - she is warned not to take insulin without proper monitoring per orders.  - she is not yet a good candidate for incretin therapy due to elevated triglycerides increasing her risk of pancreatitis.   As for the classification of her diabetes, further testing with anti-GAD  testing will be postponed until A1c reaches 8% to ensure accuracy of results.  2) Blood Pressure /Hypertension:  Her blood pressure is not controlled to target.  She is not not taking any antihypertensive medications for now.  She will be considered for intervention if it remains above 130/80 next visit.  3) Lipids/Hyperlipidemia:  Her most recent lipid panel from 01/23/20 shows uncontrolled LDL of 136 and elevated triglycerides of 386. She is advised to continue Crestor 10 mg po daily at bedtime and Fenofibrate 48 mg po daily.  Side effects and precautions discussed with her.  Will recheck lipid panel prior to next visit.  4) Vitamin D deficiency:  Her most recent vitamin D level from 04/02/19 was 21.8.  She is currently taking OTC Vitamin D3 5000 units daily.      - she is advised to maintain close follow up with Patient, No Pcp Per (Inactive) for primary care needs, as well as her other providers for optimal and coordinated care.    I spent 42 minutes in the care of the patient today including review of labs from CMP, Lipids, Thyroid Function, Hematology (current and previous including abstractions from other facilities); face-to-face time discussing  her blood glucose readings/logs, discussing hypoglycemia and hyperglycemia episodes and symptoms, medications doses, her options of short and long term treatment based on the latest standards of care / guidelines;  discussion about incorporating lifestyle medicine;  and documenting the encounter.    Please refer to Patient Instructions for Blood Glucose Monitoring and Insulin/Medications Dosing Guide"  in media tab for additional information. Please  also refer to " Patient Self Inventory" in the Media  tab for reviewed elements of pertinent patient history.  Ann Richards participated in the discussions, expressed understanding, and voiced agreement with the above plans.  All questions were answered to her satisfaction. she is  encouraged to contact clinic should she have any questions or concerns prior to her return visit.   Follow up plan: - Return in about 3 months (around 02/09/2021) for Diabetes F/U- A1c and UM in office, Previsit labs, Bring meter and logs.  Ronny BaconWhitney Thomasene Dubow, [facility] CenterFNP-BC [facility] [address] [place], [place] [address] Phone: [phone] Fax: [phone]  11/09/2020, 3:35 PM

## 2020-11-12 ENCOUNTER — Other Ambulatory Visit: Payer: Self-pay | Admitting: Nurse Practitioner

## 2020-11-12 ENCOUNTER — Encounter: Payer: Self-pay | Admitting: Nurse Practitioner

## 2020-11-12 DIAGNOSIS — E1165 Type 2 diabetes mellitus with hyperglycemia: Secondary | ICD-10-CM

## 2020-11-12 MED ORDER — DEXCOM G6 TRANSMITTER MISC: 3 refills | Status: DC

## 2020-11-12 MED ORDER — DEXCOM G6 SENSOR MISC: 3 refills | Status: DC

## 2020-11-12 NOTE — Progress Notes (Signed)
I sent in order for Dexcom sensor and transmitter to [facility] pharmacy for fulfillment.

## 2020-11-24 ENCOUNTER — Other Ambulatory Visit: Payer: Self-pay | Admitting: Nurse Practitioner

## 2020-11-24 DIAGNOSIS — E1165 Type 2 diabetes mellitus with hyperglycemia: Secondary | ICD-10-CM

## 2020-12-11 ENCOUNTER — Other Ambulatory Visit: Payer: Self-pay

## 2020-12-11 DIAGNOSIS — E1165 Type 2 diabetes mellitus with hyperglycemia: Secondary | ICD-10-CM

## 2020-12-11 MED ORDER — DEXCOM G6 SENSOR MISC: 2 refills | Status: DC

## 2020-12-15 ENCOUNTER — Encounter: Payer: Self-pay | Admitting: Nurse Practitioner

## 2020-12-23 ENCOUNTER — Telehealth: Payer: Self-pay | Admitting: Nurse Practitioner

## 2020-12-23 NOTE — Telephone Encounter (Signed)
Rx sent 

## 2020-12-23 NOTE — Telephone Encounter (Signed)
Pt states she needs dexcom meter sent to [employer]

## 2021-01-25 ENCOUNTER — Telehealth: Payer: Self-pay | Admitting: Advanced Practice Midwife

## 2021-01-25 ENCOUNTER — Other Ambulatory Visit: Payer: Self-pay

## 2021-01-25 MED ORDER — FLUCONAZOLE 100 MG PO TABS: ORAL_TABLET | ORAL | 1 refills | Status: DC

## 2021-01-25 MED ORDER — METRONIDAZOLE 500 MG PO TABS: 500.0000 mg | ORAL_TABLET | Freq: Two times a day (BID) | ORAL | 0 refills | Status: DC

## 2021-01-25 NOTE — Telephone Encounter (Signed)
Mailbox is full, will refill diflucan in Fran's absence

## 2021-01-25 NOTE — Addendum Note (Signed)
Addended by: Cyril Mourning A on: 01/25/2021 01:44 PM   Modules accepted: Orders

## 2021-01-25 NOTE — Telephone Encounter (Signed)
Patient called stating that she has a yeast infection and she is frequent with those because she is a diabetic, patient would like to know if Drenda Freeze could call her in a refill of her Yeast infection medication. Patient states that Drenda Freeze calls it in all the time for her. Please contact pt when sent.

## 2021-02-10 ENCOUNTER — Ambulatory Visit: Payer: No Typology Code available for payment source | Admitting: Nurse Practitioner

## 2021-02-11 ENCOUNTER — Telehealth: Payer: Self-pay | Admitting: Advanced Practice Midwife

## 2021-02-11 MED ORDER — FLUCONAZOLE 150 MG PO TABS: 150.0000 mg | ORAL_TABLET | Freq: Every day | ORAL | 2 refills | Status: AC

## 2021-02-11 NOTE — Telephone Encounter (Signed)
Patient went to dentist and got put on an antibiotic. She stated she is a diabetic and has since got a yeast infection and wants to know if somebody can send her something in. She hasn't been seen in this office since 02/12/20. I told her we'd probably have to have her come in for a swab, but I would message back and see since she was persistent that somebody normally just calls in something for her.

## 2021-02-22 ENCOUNTER — Other Ambulatory Visit: Payer: Self-pay | Admitting: Nurse Practitioner

## 2021-02-22 DIAGNOSIS — E1165 Type 2 diabetes mellitus with hyperglycemia: Secondary | ICD-10-CM

## 2021-03-11 ENCOUNTER — Other Ambulatory Visit: Payer: Self-pay | Admitting: Nurse Practitioner

## 2021-03-11 DIAGNOSIS — E1165 Type 2 diabetes mellitus with hyperglycemia: Secondary | ICD-10-CM

## 2021-03-22 ENCOUNTER — Other Ambulatory Visit: Payer: Self-pay | Admitting: Nurse Practitioner

## 2021-03-22 DIAGNOSIS — E1165 Type 2 diabetes mellitus with hyperglycemia: Secondary | ICD-10-CM

## 2021-05-18 ENCOUNTER — Other Ambulatory Visit: Payer: Self-pay | Admitting: Nurse Practitioner

## 2021-05-18 DIAGNOSIS — E1165 Type 2 diabetes mellitus with hyperglycemia: Secondary | ICD-10-CM

## 2021-07-12 ENCOUNTER — Telehealth: Payer: Self-pay | Admitting: Nurse Practitioner

## 2021-07-12 ENCOUNTER — Other Ambulatory Visit: Payer: Self-pay | Admitting: Nurse Practitioner

## 2021-07-12 DIAGNOSIS — E1165 Type 2 diabetes mellitus with hyperglycemia: Secondary | ICD-10-CM

## 2021-07-12 NOTE — Telephone Encounter (Signed)
Patient left a VM requesting an appt and needing a PA on her Dexcom. I tried to reach patient 2x. VM is full.

## 2021-07-21 NOTE — Patient Instructions (Incomplete)

## 2021-07-22 ENCOUNTER — Ambulatory Visit: Payer: No Typology Code available for payment source | Admitting: Nurse Practitioner

## 2021-07-22 DIAGNOSIS — E559 Vitamin D deficiency, unspecified: Secondary | ICD-10-CM

## 2021-07-22 DIAGNOSIS — E782 Mixed hyperlipidemia: Secondary | ICD-10-CM

## 2021-07-22 DIAGNOSIS — E1165 Type 2 diabetes mellitus with hyperglycemia: Secondary | ICD-10-CM

## 2021-07-28 ENCOUNTER — Ambulatory Visit: Payer: No Typology Code available for payment source | Admitting: Nurse Practitioner

## 2021-08-17 LAB — COMPREHENSIVE METABOLIC PANEL
ALT: 28 IU/L (ref 0–32)
AST: 24 IU/L (ref 0–40)
Albumin/Globulin Ratio: 1.5 (ref 1.2–2.2)
Albumin: 4.3 g/dL (ref 3.8–4.8)
Alkaline Phosphatase: 106 IU/L (ref 44–121)
BUN/Creatinine Ratio: 15 (ref 9–23)
BUN: 9 mg/dL (ref 6–20)
Bilirubin Total: 0.2 mg/dL (ref 0.0–1.2)
CO2: 20 mmol/L (ref 20–29)
Calcium: 8.9 mg/dL (ref 8.7–10.2)
Chloride: 102 mmol/L (ref 96–106)
Creatinine, Ser: 0.61 mg/dL (ref 0.57–1.00)
Globulin, Total: 2.9 g/dL (ref 1.5–4.5)
Glucose: 280 mg/dL — ABNORMAL HIGH (ref 70–99)
Potassium: 4.2 mmol/L (ref 3.5–5.2)
Sodium: 141 mmol/L (ref 134–144)
Total Protein: 7.2 g/dL (ref 6.0–8.5)
eGFR: 122 mL/min/{1.73_m2} (ref >59)

## 2021-08-17 LAB — LIPID PANEL
Chol/HDL Ratio: 8.4 ratio — ABNORMAL HIGH (ref 0.0–4.4)
Cholesterol, Total: 194 mg/dL (ref 100–199)
HDL: 23 mg/dL — ABNORMAL LOW (ref >39)
LDL Chol Calc (NIH): 91 mg/dL (ref 0–99)
Triglycerides: 485 mg/dL — ABNORMAL HIGH (ref 0–149)
VLDL Cholesterol Cal: 80 mg/dL — ABNORMAL HIGH (ref 5–40)

## 2021-08-17 LAB — TSH: TSH: 1.71 u[IU]/mL (ref 0.450–4.500)

## 2021-08-17 LAB — T4, FREE: Free T4: 1.33 ng/dL (ref 0.82–1.77)

## 2021-08-17 LAB — VITAMIN D 25 HYDROXY (VIT D DEFICIENCY, FRACTURES): Vit D, 25-Hydroxy: 19.4 ng/mL — ABNORMAL LOW (ref 30.0–100.0)

## 2021-08-19 ENCOUNTER — Ambulatory Visit: Payer: No Typology Code available for payment source | Admitting: Nurse Practitioner

## 2021-08-30 ENCOUNTER — Other Ambulatory Visit: Payer: Self-pay

## 2021-08-30 ENCOUNTER — Encounter: Payer: Self-pay | Admitting: Nurse Practitioner

## 2021-08-30 ENCOUNTER — Encounter: Payer: No Typology Code available for payment source | Admitting: Nurse Practitioner

## 2021-08-30 NOTE — Patient Instructions (Signed)
Diabetes Mellitus Emergency Preparedness Plan ?A diabetes emergency preparedness plan is a checklist to make sure you have everything you need to manage your diabetes in case of an emergency, such as an evacuation, natural disaster, national security emergency, or pandemic lockdown. ?Managing your diabetes is something you have to do all day every day. The American Diabetes Association and the [employer] of Endocrinology both recommend putting together an emergency diabetes kit. Your kit should include important information and documents as well as all the supplies you will need to manage your diabetes for at least 1 week. Store it in a portable, Engineer, materials. The best time to start making your emergency kit is now. ?How to make your emergency kit ?Collect information and documents ?Include the following information and documents in your kit: ?The type of diabetes you have. ?A copy of your health insurance cards and photo ID. ?A list of all your other medical conditions, allergies, and surgeries. ?A list of all your medicines and doses with the contact information for your pharmacy. Ask your health care provider for a list of your current medicines. ?Any recent lab results, including your latest hemoglobin A1C (HbA1C). ?The make, model, and serial number of your insulin pump, if you use one. Also include contact information for the manufacturer. ?Contact information for people who should be notified in case of an emergency. Include your health care provider's name, address, and phone number. ?Collect diabetes care items ?Include the following diabetes care items in your kit: ?At least a 1-week supply of: ?Oral medicines. ?Insulin. ?Blood glucose testing supplies. These include testing strips, lancets, and extra batteries for your blood glucose monitor and pump. ?A charger for the continuous glucose monitor (CGM) receiver and pump. ?Any extra supplies needed for your CGM or pump. ?A supply of  glucagon, glucose tablets, juice, soda, or hard candy in case of hypoglycemia. ?Coolers or cold packs. ?A safe container for syringes, needles, and lancets. ? ?Other preparations ?Other things to consider doing as part of your emergency plan: ?Make sure that your mobile phone is charged and that you have an extra charger, cable, or batteries. ?Choose a meeting place for family members. ?Wear a medical alert or ID bracelet. ?If you have a child with diabetes, make sure your child's school has a copy of his or her emergency plan, including the name of the staff member who will assist your child. ?Where to find more information ?American Diabetes Association: www.diabetes.org ?Centers for Disease Control and Prevention: blogs.[employer]Mirror.com.cy ?Summary ?A diabetes emergency preparedness plan is a checklist to make sure you have everything you need in case of an emergency. ?Your kit should include important information and documents as well as all the supplies you will need to manage your condition for at least 1 week. ?Store your kit in a portable, Engineer, materials. ?The best time to start making your emergency kit is now. ?This information is not intended to replace advice given to you by your health care provider. Make sure you discuss any questions you have with your health care provider. ?Document Revised: 12/12/2019 Document Reviewed: 12/12/2019 ?Elsevier Patient Education ? [place]. ? ?

## 2021-08-30 NOTE — Progress Notes (Signed)
Erroneous encounter

## 2021-09-02 ENCOUNTER — Encounter: Payer: Self-pay | Admitting: Nurse Practitioner

## 2021-09-13 ENCOUNTER — Telehealth: Payer: Self-pay | Admitting: Nurse Practitioner

## 2021-09-13 ENCOUNTER — Ambulatory Visit: Payer: No Typology Code available for payment source | Admitting: Nurse Practitioner

## 2021-09-13 ENCOUNTER — Encounter: Payer: Self-pay | Admitting: Nurse Practitioner

## 2021-09-13 NOTE — Patient Instructions (Incomplete)

## 2021-09-13 NOTE — Telephone Encounter (Signed)
Patient dismissed from [facility] effective 09/13/21 for multiple no show/cancellations - letter mailed and sent via mychart as well. ?

## 2021-09-14 NOTE — Telephone Encounter (Signed)
Patient called back to reschedule her appt. I made her aware, per Rayetta Pigg, NP we are not able to reschedule anymore appointments due to multiple missed appointments. She said oh ok, and thank you.  ?

## 2023-04-28 ENCOUNTER — Ambulatory Visit: Payer: No Typology Code available for payment source | Admitting: Family Medicine

## 2023-05-03 ENCOUNTER — Encounter: Payer: Self-pay | Admitting: Family Medicine

## 2024-03-24 ENCOUNTER — Other Ambulatory Visit: Payer: Self-pay

## 2024-03-24 ENCOUNTER — Emergency Department (HOSPITAL_COMMUNITY)

## 2024-03-24 ENCOUNTER — Emergency Department (HOSPITAL_COMMUNITY)
Admission: EM | Admit: 2024-03-24 | Discharge: 2024-04-20 | Disposition: E | Attending: Emergency Medicine | Admitting: Emergency Medicine

## 2024-03-24 ENCOUNTER — Encounter (HOSPITAL_COMMUNITY): Payer: Self-pay | Admitting: Emergency Medicine

## 2024-03-24 DIAGNOSIS — Z7984 Long term (current) use of oral hypoglycemic drugs: Secondary | ICD-10-CM | POA: Insufficient documentation

## 2024-03-24 DIAGNOSIS — E1110 Type 2 diabetes mellitus with ketoacidosis without coma: Secondary | ICD-10-CM | POA: Diagnosis not present

## 2024-03-24 DIAGNOSIS — Z794 Long term (current) use of insulin: Secondary | ICD-10-CM | POA: Diagnosis not present

## 2024-03-24 DIAGNOSIS — I469 Cardiac arrest, cause unspecified: Secondary | ICD-10-CM | POA: Diagnosis not present

## 2024-03-24 DIAGNOSIS — J45909 Unspecified asthma, uncomplicated: Secondary | ICD-10-CM | POA: Insufficient documentation

## 2024-03-24 DIAGNOSIS — R0602 Shortness of breath: Secondary | ICD-10-CM | POA: Diagnosis present

## 2024-03-24 LAB — CBC WITH DIFFERENTIAL/PLATELET
Abs Immature Granulocytes: 0.2 K/uL — ABNORMAL HIGH (ref 0.00–0.07)
Basophils Absolute: 0 K/uL (ref 0.0–0.1)
Basophils Relative: 0 %
Eosinophils Absolute: 0.1 K/uL (ref 0.0–0.5)
Eosinophils Relative: 1 %
HCT: 44.9 % (ref 36.0–46.0)
Hemoglobin: 13.6 g/dL (ref 12.0–15.0)
Lymphocytes Relative: 70 %
Lymphs Abs: 8.5 K/uL — ABNORMAL HIGH (ref 0.7–4.0)
MCH: 27.4 pg (ref 26.0–34.0)
MCHC: 30.3 g/dL (ref 30.0–36.0)
MCV: 90.5 fL (ref 80.0–100.0)
Metamyelocytes Relative: 1 %
Monocytes Absolute: 0.5 K/uL (ref 0.1–1.0)
Monocytes Relative: 4 %
Myelocytes: 1 %
Neutro Abs: 2.8 K/uL (ref 1.7–7.7)
Neutrophils Relative %: 23 %
Platelets: 173 K/uL (ref 150–400)
RBC: 4.96 MIL/uL (ref 3.87–5.11)
RDW: 14.5 % (ref 11.5–15.5)
WBC: 12.2 K/uL — ABNORMAL HIGH (ref 4.0–10.5)
nRBC: 0 % (ref 0.0–0.2)

## 2024-03-24 LAB — I-STAT CHEM 8, ED
BUN: 12 mg/dL (ref 6–20)
Calcium, Ion: 1.08 mmol/L — ABNORMAL LOW (ref 1.15–1.40)
Chloride: 102 mmol/L (ref 98–111)
Creatinine, Ser: 1 mg/dL (ref 0.44–1.00)
Glucose, Bld: >700 mg/dL (ref 70–99)
HCT: 46 % (ref 36.0–46.0)
Hemoglobin: 15.6 g/dL — ABNORMAL HIGH (ref 12.0–15.0)
Potassium: 4.9 mmol/L (ref 3.5–5.1)
Sodium: 132 mmol/L — ABNORMAL LOW (ref 135–145)
TCO2: 13 mmol/L — ABNORMAL LOW (ref 22–32)

## 2024-03-24 LAB — COMPREHENSIVE METABOLIC PANEL WITH GFR
ALT: 204 U/L — ABNORMAL HIGH (ref 0–44)
AST: 506 U/L — ABNORMAL HIGH (ref 15–41)
Albumin: 3.7 g/dL (ref 3.5–5.0)
Alkaline Phosphatase: 129 U/L — ABNORMAL HIGH (ref 38–126)
Anion gap: 29 — ABNORMAL HIGH (ref 5–15)
BUN: 11 mg/dL (ref 6–20)
CO2: 8 mmol/L — ABNORMAL LOW (ref 22–32)
Calcium: 9 mg/dL (ref 8.9–10.3)
Chloride: 94 mmol/L — ABNORMAL LOW (ref 98–111)
Creatinine, Ser: 1.25 mg/dL — ABNORMAL HIGH (ref 0.44–1.00)
GFR, Estimated: 58 mL/min — ABNORMAL LOW (ref >60)
Glucose, Bld: 755 mg/dL (ref 70–99)
Potassium: 4.7 mmol/L (ref 3.5–5.1)
Sodium: 131 mmol/L — ABNORMAL LOW (ref 135–145)
Total Bilirubin: 0.3 mg/dL (ref 0.0–1.2)
Total Protein: 6.4 g/dL — ABNORMAL LOW (ref 6.5–8.1)

## 2024-03-24 LAB — BLOOD GAS, VENOUS
Acid-base deficit: 20.1 mmol/L — ABNORMAL HIGH (ref 0.0–2.0)
Bicarbonate: 10.3 mmol/L — ABNORMAL LOW (ref 20.0–28.0)
Drawn by: 57449
O2 Saturation: 44.6 %
Patient temperature: 36.1
pCO2, Ven: 38 mmHg — ABNORMAL LOW (ref 44–60)
pH, Ven: 7.03 — CL (ref 7.25–7.43)
pO2, Ven: 35 mmHg (ref 32–45)

## 2024-03-24 LAB — BETA-HYDROXYBUTYRIC ACID: Beta-Hydroxybutyric Acid: 0.84 mmol/L — ABNORMAL HIGH (ref 0.05–0.27)

## 2024-03-24 LAB — CBG MONITORING, ED: Glucose-Capillary: 517 mg/dL (ref 70–99)

## 2024-03-24 MED ORDER — SODIUM BICARBONATE 8.4 % IV SOLN
INTRAVENOUS | Status: AC
  Filled 2024-03-24: qty 150

## 2024-03-24 MED ORDER — MIDAZOLAM HCL 2 MG/2ML IJ SOLN: 2.0000 mg | INTRAMUSCULAR | Status: DC | PRN

## 2024-03-24 MED ORDER — EPINEPHRINE 1 MG/10ML IV SOSY
PREFILLED_SYRINGE | INTRAVENOUS | Status: AC
  Filled 2024-03-24: qty 40

## 2024-03-24 MED ORDER — TENECTEPLASE 50 MG IV KIT
PACK | INTRAVENOUS | Status: AC
  Filled 2024-03-24: qty 10

## 2024-03-24 MED ORDER — DEXTROSE 50 % IV SOLN: 0.0000 mL | INTRAVENOUS | Status: DC | PRN

## 2024-03-24 MED ORDER — POTASSIUM CHLORIDE 10 MEQ/100ML IV SOLN
10.0000 meq | INTRAVENOUS | Status: AC
  Filled 2024-03-24: qty 100

## 2024-03-24 MED ORDER — INSULIN REGULAR(HUMAN) IN NACL 100-0.9 UT/100ML-% IV SOLN
INTRAVENOUS | Status: DC
  Filled 2024-03-24: qty 100

## 2024-03-24 MED ORDER — LACTATED RINGERS IV SOLN: INTRAVENOUS | Status: DC

## 2024-03-24 MED ORDER — NOREPINEPHRINE 4 MG/250ML-% IV SOLN: 0.0000 ug/min | INTRAVENOUS | Status: DC

## 2024-03-24 MED ORDER — SODIUM BICARBONATE 8.4 % IV SOLN
INTRAVENOUS | Status: AC
  Filled 2024-03-24: qty 50

## 2024-03-24 MED ORDER — IOHEXOL 350 MG/ML SOLN
75.0000 mL | Freq: Once | INTRAVENOUS | Status: DC | PRN
Start: 2024-03-24 — End: 2024-03-24

## 2024-03-24 MED ORDER — LACTATED RINGERS IV BOLUS: 1000.0000 mL | Freq: Once | INTRAVENOUS | Status: DC

## 2024-03-24 MED ORDER — NOREPINEPHRINE 4 MG/250ML-% IV SOLN
INTRAVENOUS | Status: AC
  Administered 2024-03-24: 20 ug/min via INTRAVENOUS
  Filled 2024-03-24: qty 250

## 2024-03-24 MED ORDER — DEXTROSE IN LACTATED RINGERS 5 % IV SOLN: INTRAVENOUS | Status: DC

## 2024-03-24 MED ORDER — FENTANYL 2500MCG IN NS 250ML (10MCG/ML) PREMIX INFUSION: 0.0000 ug/h | INTRAVENOUS | Status: DC

## 2024-03-24 MED ORDER — LACTATED RINGERS IV BOLUS
2000.0000 mL | Freq: Once | INTRAVENOUS | Status: AC
  Administered 2024-03-24: 2000 mL via INTRAVENOUS

## 2024-03-24 MED ORDER — FENTANYL 2500MCG IN NS 250ML (10MCG/ML) PREMIX INFUSION
INTRAVENOUS | Status: AC
  Administered 2024-03-24: 25 ug/h via INTRAVENOUS
  Filled 2024-03-24: qty 250

## 2024-04-20 NOTE — Progress Notes (Signed)
   April 15, 2024 0700  Spiritual Encounters  Type of Visit Initial  Care provided to: Family  Referral source Other (comment) (Charge Nurse, [place])  Reason for visit Patient death  OnCall Visit Yes   Chaplain responded to a call for support of the family. The patient's family surrounded her with their presence and love ,sharing stories of Ann Richards and the life she lived. We prayed for her and them in this difficult time.   I encouraged Ann Richards's family to be gentle with themselves in the coming days allow for time to grieve.   The family has already reached out to the funeral home and can provide that information.  Carley [facility]  [phone]

## 2024-04-20 NOTE — ED Notes (Signed)
 Pt stating she is unable to breathe. Pt laid back in bed and began having seizure like activity. Pt laid on side to prevent aspiration. At this point pt bradycardic on cardiac monitor. Pulse checked by this RN. No pulse felt. Compressions started.

## 2024-04-20 NOTE — ED Notes (Signed)
 Unable to get an accurate O2 sat on pt. Place pulse ox on ear but still not a good waveform.

## 2024-04-20 NOTE — ED Notes (Signed)
 Fentanyl  drip increased to 150mcg/hr per verbal orders Dr Lorette. Fentanyl  100mcg bolus given per orders Dr Lorette.

## 2024-04-20 NOTE — Progress Notes (Signed)
 RT at bedside for CODE blue.  Patient intubated by MD with 7.5 and secured at 25 @ teeth.  Equal BBS and positive CO2 color change.  Patient ventilated with 100% AMBU bag until return of pulses at which time patient was placed on vent on PRVC  500/16/100/+5.  RT's remained at bedside and patient continued to lose pulses, patient was removed from vent and ventilated with 100% ambu bag during rounds of CPR.

## 2024-04-20 NOTE — ED Notes (Signed)
 Family at bedside with patient.

## 2024-04-20 NOTE — ED Notes (Signed)
 Pt continuously reporting she is unable to breathe. NRB mask on pt- unable to get accurate O2 reading. MD aware.

## 2024-04-20 NOTE — ED Triage Notes (Addendum)
 Pt arrives w/ [facility] w/ c/o SHOB. Labored breathing upon arrival. 2L placed on pt. 100% RA upon arrival to ED- poor pleth. Pt clammy and diaphoretic.  Hx diabetes Unable to get BP en route.  CBG upon arrival to ED 517

## 2024-04-20 NOTE — ED Provider Notes (Addendum)
 [facility] AT [facility] Provider Note   CSN: [id number] Arrival date & time: 04/16/24  9583     Patient presents with: Hyperglycemia   Ann Richards is a 33 y.o. female.   Difficult to obtain history secondary to condition.  Patient apparently called EMS secondary to shortness of breath.  EMS unable to obtain vital signs prior to arrival.  Patient with history of asthma and diabetes.  She has no recent sick contacts.  No fevers.  Mother states she had felt near syncopal recently but otherwise no recent illnesses.    Hyperglycemia      Prior to Admission medications   Medication Sig Start Date End Date Taking? Authorizing Provider  albuterol  (VENTOLIN  HFA) 108 (90 Base) MCG/ACT inhaler INHALE TWO PUFFS INTO THE LUNGS EVERY 4 HOURS AS NEEDED 10/17/17   Alphonsa Elsie RAMAN, MD  ALPRAZolam  (XANAX ) 0.5 MG tablet Take 0.5 mg by mouth at bedtime as needed for anxiety.    [provider]  APPLE CIDER VINEGAR PO Take by mouth.    [provider]  cephALEXin  (KEFLEX ) 500 MG capsule Take 500 mg by mouth every 8 (eight) hours. 11/03/20   [provider]  Cholecalciferol (VITAMIN D3) 125 MCG (5000 UT) CAPS Take 1 capsule (5,000 Units total) by mouth daily. 04/05/19   Nida, Gebreselassie W, MD  Continuous Blood Gluc Receiver (DEXCOM G6 RECEIVER) DEVI 1 each by Does not apply route in the morning, at noon, in the evening, and at bedtime.    [provider]  Continuous Blood Gluc Sensor (DEXCOM G6 SENSOR) MISC Change sensor every 10 days as instructed 12/11/20   Therisa Benton PARAS, NP  Continuous Blood Gluc Transmit (DEXCOM G6 TRANSMITTER) MISC Change transmitter every 90 days 11/12/20   Therisa Benton PARAS, NP  doxepin (SINEQUAN) 10 MG capsule Take 10 mg by mouth at bedtime. 07/06/19   [provider]  escitalopram  (LEXAPRO ) 20 MG tablet Take 20 mg by mouth daily. 09/03/20   [provider]  fenofibrate   (TRICOR ) 48 MG tablet TAKE 1 TABLET BY MOUTH EVERY DAY 08/04/20   Nida, Gebreselassie W, MD  glipiZIDE  (GLUCOTROL  XL) 10 MG 24 hr tablet TAKE 1 TABLET BY MOUTH  DAILY WITH BREAKFAST 02/24/21   Therisa Benton PARAS, NP  glucose blood (RELION GLUCOSE TEST STRIPS) test strip Use as instructed 04/27/20   Nida, Gebreselassie W, MD  hydroxypropyl methylcellulose / hypromellose (ISOPTO TEARS / GONIOVISC) 2.5 % ophthalmic solution Place 1 drop into both eyes 3 (three) times daily as needed for dry eyes.    [provider]  ibuprofen  (ADVIL ) 800 MG tablet Take 1 tablet (800 mg total) by mouth every 8 (eight) hours as needed. 07/30/20   Cresenzo-Dishmon, Cathlean, CNM  insulin  glargine (LANTUS  SOLOSTAR) 100 UNIT/ML Solostar Pen Inject 35 Units into the skin at bedtime. 03/12/21   Therisa Benton PARAS, NP  insulin  lispro (HUMALOG  KWIKPEN) 100 UNIT/ML KwikPen Inject 8-14 Units into the skin 3 (three) times daily. 11/25/20   Therisa Benton PARAS, NP  Insulin  Pen Needle (B-D ULTRAFINE III SHORT PEN) 31G X 8 MM MISC 1 each by Does not apply route as directed. Use as directed twice daily 07/09/20   Nida, Gebreselassie W, MD  Insulin  Syringe-Needle U-100 (RELION INSULIN  SYR 0.3CC/30G) 30G X 5/16 0.3 ML MISC Use to inject insulin  2 x a day 10/24/19   Nida, Gebreselassie W, MD  metroNIDAZOLE  (FLAGYL ) 500 MG tablet Take 1 tablet (500 mg total) by mouth  2 (two) times daily. 01/25/21   Signa Delon LABOR, NP  Multiple Vitamin (MULTIVITAMIN WITH MINERALS) TABS tablet Take 1 tablet by mouth daily.    [provider]  oxyCODONE  (ROXICODONE ) 5 MG immediate release tablet Take 1 tablet (5 mg total) by mouth every 4 (four) hours as needed for severe pain. 07/22/20   Bero, Michael M, MD  ReliOn Ultra Thin Lancets MISC Use to test glucose 4 times a day 04/27/20   Nida, Gebreselassie W, MD  rosuvastatin  (CRESTOR ) 10 MG tablet TAKE 1 TABLET BY MOUTH EVERY DAY 08/04/20   Nida, Gebreselassie W, MD    Allergies: Metformin  and related and  Morphine  and codeine    Review of Systems  Updated Vital Signs BP (!) 79/69   Pulse (!) 143   Resp (!) 24   SpO2 93%   Physical Exam Vitals and nursing note reviewed.  Constitutional:      Appearance: She is well-developed.  HENT:     Head: Normocephalic and atraumatic.  Cardiovascular:     Rate and Rhythm: Regular rhythm. Tachycardia present.  Pulmonary:     Effort: No respiratory distress.     Breath sounds: No stridor.     Comments: Tachypnea Abdominal:     General: There is no distension.  Musculoskeletal:     Cervical back: Normal range of motion.  Neurological:     Mental Status: She is alert.     (all labs ordered are listed, but only abnormal results are displayed) Labs Reviewed  CBC WITH DIFFERENTIAL/PLATELET - Abnormal; Notable for the following components:      Result Value   WBC 12.2 (*)    Lymphs Abs 8.5 (*)    Abs Immature Granulocytes 0.20 (*)    All other components within normal limits  COMPREHENSIVE METABOLIC PANEL WITH GFR - Abnormal; Notable for the following components:   Sodium 131 (*)    Chloride 94 (*)    CO2 8 (*)    Glucose, Bld 755 (*)    Creatinine, Ser 1.25 (*)    Total Protein 6.4 (*)    AST 506 (*)    ALT 204 (*)    Alkaline Phosphatase 129 (*)    GFR, Estimated 58 (*)    Anion gap 29 (*)    All other components within normal limits  BLOOD GAS, VENOUS - Abnormal; Notable for the following components:   pH, Ven 7.03 (*)    pCO2, Ven 38 (*)    Bicarbonate 10.3 (*)    Acid-base deficit 20.1 (*)    All other components within normal limits  CBG MONITORING, ED - Abnormal; Notable for the following components:   Glucose-Capillary 517 (*)    All other components within normal limits  I-STAT CHEM 8, ED - Abnormal; Notable for the following components:   Sodium 132 (*)    Glucose, Bld >700 (*)    Calcium , Ion 1.08 (*)    TCO2 13 (*)    Hemoglobin 15.6 (*)    All other components within normal limits  BETA-HYDROXYBUTYRIC ACID   URINALYSIS, ROUTINE W REFLEX MICROSCOPIC  CBC WITH DIFFERENTIAL/PLATELET  PREGNANCY, URINE  CBG MONITORING, ED    EKG: EKG Interpretation Date/Time:  Sunday 04-17-24 04:33:32 EDT Ventricular Rate:  138 PR Interval:  192 QRS Duration:  110 QT Interval:  376 QTC Calculation: 570 R Axis:   98  Text Interpretation: Sinus tachycardia Consider right atrial enlargement Borderline right axis deviation Low voltage, precordial leads Minimal  ST depression, diffuse leads Prolonged QT interval Confirmed by Lorette Mayo [phone]) on 04/10/24 5:00:07 AM  Radiology: DG Chest Portable 1 View Result Date: 2024/04/10 EXAM: 1 VIEW XRAY OF THE CHEST 04-10-24 05:03:58 AM COMPARISON: AP and lateral radiographs of the chest dated 04/17/2018. CLINICAL HISTORY: eval for sob. SHOB. Labored breathing upon arrival. 2L placed on pt. 100% RA upon arrival to ED; Hx diabetes; Unable to get BP en route. ; CBG upon arrival to ED 517 FINDINGS: LUNGS AND PLEURA: Low lung volumes. No focal pulmonary opacity. No pulmonary edema. No pleural effusion. No pneumothorax. HEART AND MEDIASTINUM: No acute abnormality of the cardiac and mediastinal silhouettes. BONES AND SOFT TISSUES: No acute osseous abnormality. IMPRESSION: 1. No acute cardiopulmonary process. 2. Low lung volumes. Electronically signed by: Evalene Coho MD 04-10-24 05:19 AM EDT RP Workstation: [id number]     .Critical Care  Performed by: Lorette Mayo, MD Authorized by: Lorette Mayo, MD   Critical care provider statement:    Critical care time (minutes):  75   Critical care was necessary to treat or prevent imminent or life-threatening deterioration of the following conditions:  Metabolic crisis, circulatory failure, cardiac failure and respiratory failure   Critical care was time spent personally by me on the following activities:  Development of treatment plan with patient or surrogate, discussions with consultants, evaluation of patient's  response to treatment, examination of patient, ordering and review of laboratory studies, ordering and review of radiographic studies, ordering and performing treatments and interventions, pulse oximetry, re-evaluation of patient's condition and review of old charts Procedure Name: Intubation Date/Time: 04-10-24 7:14 AM  Performed by: Lorette Mayo, MDPre-anesthesia Checklist: Patient identified, Patient being monitored, Emergency Drugs available, Timeout performed and Suction available Oxygen  Delivery Method: Non-rebreather mask Preoxygenation: Pre-oxygenation with 100% oxygen  Induction Type: Rapid sequence Ventilation: Mask ventilation without difficulty Laryngoscope Size: Mac Grade View: Grade I Tube size: 7.5 mm Number of attempts: 1 Airway Equipment and Method: Stylet Placement Confirmation: ETT inserted through vocal cords under direct vision, CO2 detector and Breath sounds checked- equal and bilateral Secured at: 25 cm Tube secured with: ETT holder Dental Injury: Teeth and Oropharynx as per pre-operative assessment  Future Recommendations: Recommend- induction with short-acting agent, and alternative techniques readily available    CPR  Date/Time: 04/10/2024 7:16 AM  Performed by: Lorette Mayo, MD Authorized by: Lorette Mayo, MD  CPR Procedure Details:      Amount of time prior to administration of ACLS/BLS (minutes):  0   ACLS/BLS initiated by EMS: No     CPR/ACLS performed in the ED: Yes     Duration of CPR (minutes):  80   Outcome: Pt declared dead    CPR performed via ACLS guidelines under my direct supervision.  See RN documentation for details including defibrillator use, medications, doses and timing. Comments:     ROSC was obtained multiple times however patient would bradycardia down and lose pulses within a couple minutes.    Medications Ordered in the ED  sodium bicarbonate 1 mEq/mL injection (has no administration in time range)  fentaNYL  in NS  (50mcg/ml) infusion-PREMIX (25 mcg/hr Intravenous New Bag/Given 04/10/2024 0524)  insulin  regular, human (MYXREDLIN ) 100 units/ 100 mL infusion (has no administration in time range)  lactated ringers  infusion (has no administration in time range)  dextrose  5 % in lactated ringers  infusion (has no administration in time range)  dextrose  50 % solution 0-50 mL (has no administration in time range)  potassium chloride  10 mEq in 100  mL IVPB (has no administration in time range)  midazolam  (VERSED ) injection 2 mg (has no administration in time range)  norepinephrine (LEVOPHED) 4mg  in (0.016 mg/mL) premix infusion (20 mcg/min Intravenous New Bag/Given 04-11-24 0550)  tenecteplase (TNKASE) 50 MG injection for PE/MI (has no administration in time range)  iohexol  (OMNIPAQUE ) 350 MG/ML injection 75 mL (has no administration in time range)  EPINEPHrine (ADRENALIN) 1 MG/10ML injection (has no administration in time range)  sodium bicarbonate 1 mEq/mL injection (has no administration in time range)  lactated ringers  bolus 2,000 mL (2,000 mLs Intravenous New Bag/Given 04/11/2024 0449)                                    Medical Decision Making Amount and/or Complexity of Data Reviewed Labs: ordered. Radiology: ordered. ECG/medicine tests: ordered.  Risk Prescription drug management.  Relatively clear, consider possible DKA as she has a history of that in the past.  Possible PE.  Pneumonia as well.  Will start with labs, chest x-ray.  CBG above 500.  She is got soft pressures which I do not necessarily trust secondary to the narrow pulse pressure but she is pretty tachycardic so we will start fluids.  Considered a bolus of insulin  secondary to her blood sugar however need to see what her potassium is first.   Informed by  nursing that patient becoming more agitated and not keeping leads/IV/pulse ox/O2 on. I discussed I was hesitant to sedate her secondary to her wob. A few minutes later I was  asked back to bedside and on arrival patient was getting CPR. Reportedly she had bradycardia and then seizure like activity. She did have some blood on her tongue. See code documentation for meds and times but ultimately patient given multiple doses of bicarb for presumed acidosis, epinephrine, intubation and had ROSC. Repeat ECG still sinus tachycardia. Istat labs resulted shortly afterwards and showed acidosis and significnt hyperglycemia so an insulin  infusion initiated. Will likely need to rule out PE as the level of acidosis seen on the istat is unlikely enough to cause respiratory/cardiac arrest.  Mother at bedside and kept up to date during and after code.  Patient had bradycardia another time and lost pulses again.  Patient got ROSC for short period of time and then once again became pulseless.  She was PEA with a bradycardic narrow rhythm the whole time.  At 1 point when she had ROSC she had a wide fast rhythm so amiodarone was given with no change in the rhythm.  During the last round of CPR TNK was given since patient was young, chief complaint was short of breath and was tachycardic with low blood pressure.  CPR was continued for another 20 minutes afterwards without any change in rhythm or pulses and patient was consistently PEA.  Bedside ultrasound was utilized multiple times with basically no significant ejection fraction.  After over an hour of ACLS, CPR, TNK and all measures like a thing to try to help her patient remained in PEA and time of death was called at [phone].  Suspect the cause was acidosis although an acute cardiac or pulmonary event could have exacerbated her blood sugar as well.  Final diagnoses:  Diabetic ketoacidosis without coma associated with type 2 diabetes mellitus (HCC)  Cardiac arrest [facility])    ED Discharge Orders     None          Trent Theisen,  Selinda, MD April 02, 2024 9360    Lorette Selinda, MD 04/02/2024 [phone]

## 2024-04-20 NOTE — ED Notes (Signed)
 Patient escorted to morgue with security.

## 2024-04-20 NOTE — ED Notes (Signed)
 Patients belongings bag with shirt going down to morgue with patient

## 2024-04-20 NOTE — ED Notes (Signed)
 Chaplin at bedside w/ family.

## 2024-04-20 NOTE — ED Notes (Signed)
 0505 patient brady down and pulse lost. Code blue cpr/acls initiated. 0508-epi 1mg  given no pulse continue cpr. 0509-bicarb given cpr continues. 0510 pulse check, no pulse continue cpr. 1 mg epi given. Cpr continues. 0512-bicarb given cpr continues. 9486- pulse check, no pulse continue cpr. 0513 1 mg epi given cpr continued. 0515- No pulse. Patient intubated 7.5 ET tube 25 at the teeth. 1 mg epi given continue cpr. 0517 pulse check no pulse cpr continued. 0519- pulse check pulse noted. 0520- 1 amp bicarb given.  9458- patient noted to brady down to 40's. Cpr initiated. 0542-atropine 1mg . 0543 1mg  epi given  0543 no pulse cpr continues. 0545- no pulse 0546- 1 mg epi given 0547 1 amp bicarb given  0547-pulse check, pulse noted.  0553-amio 150mg  0555- no pulse cpr restarted. 0556- 1 mg epi 0556-bicarb given. Cpr continues. 0558-cpr check no pulse continue cpr. 0559- 1 mg epi given. 0601-pulse check no no pulse continue cpr. 0604- 45mg  TNK 0605 1 mg epi given, cpr continues. 9392- pulse check no pulse continue cpr. 0609-pulse check no pulse continue cpr 0610- 1mg  ei given. 1 amp bicarb given. 9386- pulse check no pulse continue cpr. 0614-pulse check no pulse continue cpr. 0616- no pulse pea on monitor. 9377- expired

## 2024-04-20 NOTE — ED Notes (Addendum)
 [facility] contacted, spoke w/ stacey stoe. Reference number [id number]. Reports pt is suitable for tissue & eye donation.

## 2024-04-20 NOTE — ED Notes (Signed)
 100 mcg fent 0534

## 2024-04-20 DEATH — deceased
